# Patient Record
Sex: Female | Born: 1975 | Race: White | Hispanic: No | Marital: Single | Smoking: Former smoker
Health system: Southern US, Community
[De-identification: ages and names within clinical notes are randomized; demographics above are authoritative.]

## PROBLEM LIST (undated history)

## (undated) DIAGNOSIS — F329 Major depressive disorder, single episode, unspecified: Secondary | ICD-10-CM

## (undated) DIAGNOSIS — F319 Bipolar disorder, unspecified: Secondary | ICD-10-CM

## (undated) DIAGNOSIS — J302 Other seasonal allergic rhinitis: Secondary | ICD-10-CM

## (undated) DIAGNOSIS — F32A Depression, unspecified: Secondary | ICD-10-CM

## (undated) DIAGNOSIS — F909 Attention-deficit hyperactivity disorder, unspecified type: Secondary | ICD-10-CM

## (undated) DIAGNOSIS — M549 Dorsalgia, unspecified: Secondary | ICD-10-CM

## (undated) DIAGNOSIS — R51 Headache: Secondary | ICD-10-CM

## (undated) DIAGNOSIS — F419 Anxiety disorder, unspecified: Secondary | ICD-10-CM

## (undated) DIAGNOSIS — E559 Vitamin D deficiency, unspecified: Secondary | ICD-10-CM

## (undated) DIAGNOSIS — I10 Essential (primary) hypertension: Secondary | ICD-10-CM

## (undated) DIAGNOSIS — F429 Obsessive-compulsive disorder, unspecified: Secondary | ICD-10-CM

## (undated) DIAGNOSIS — K219 Gastro-esophageal reflux disease without esophagitis: Secondary | ICD-10-CM

## (undated) DIAGNOSIS — D649 Anemia, unspecified: Secondary | ICD-10-CM

## (undated) HISTORY — DX: Other seasonal allergic rhinitis: J30.2

## (undated) HISTORY — DX: Anxiety disorder, unspecified: F41.9

## (undated) HISTORY — DX: Bipolar disorder, unspecified: F31.9

## (undated) HISTORY — DX: Headache: R51

## (undated) HISTORY — DX: Obsessive-compulsive disorder, unspecified: F42.9

## (undated) HISTORY — PX: CARPAL TUNNEL RELEASE: SHX101

## (undated) HISTORY — DX: Dorsalgia, unspecified: M54.9

## (undated) HISTORY — DX: Attention-deficit hyperactivity disorder, unspecified type: F90.9

## (undated) HISTORY — DX: Depression, unspecified: F32.A

## (undated) HISTORY — DX: Major depressive disorder, single episode, unspecified: F32.9

---

## 2005-04-03 ENCOUNTER — Emergency Department (HOSPITAL_COMMUNITY): Admission: EM | Admit: 2005-04-03 | Discharge: 2005-04-03 | Payer: Self-pay | Admitting: Family Medicine

## 2006-11-03 ENCOUNTER — Emergency Department (HOSPITAL_COMMUNITY): Admission: EM | Admit: 2006-11-03 | Discharge: 2006-11-03 | Payer: Self-pay | Admitting: Emergency Medicine

## 2006-11-11 ENCOUNTER — Emergency Department (HOSPITAL_COMMUNITY): Admission: EM | Admit: 2006-11-11 | Discharge: 2006-11-11 | Payer: Self-pay | Admitting: Family Medicine

## 2006-12-31 ENCOUNTER — Emergency Department (HOSPITAL_COMMUNITY): Admission: EM | Admit: 2006-12-31 | Discharge: 2006-12-31 | Payer: Self-pay | Admitting: Emergency Medicine

## 2009-02-26 ENCOUNTER — Emergency Department (HOSPITAL_COMMUNITY): Admission: EM | Admit: 2009-02-26 | Discharge: 2009-02-26 | Payer: Self-pay | Admitting: Emergency Medicine

## 2009-12-26 ENCOUNTER — Emergency Department (HOSPITAL_COMMUNITY): Admission: EM | Admit: 2009-12-26 | Discharge: 2009-12-26 | Payer: Self-pay | Admitting: Emergency Medicine

## 2011-10-02 ENCOUNTER — Other Ambulatory Visit: Payer: Self-pay | Admitting: Family Medicine

## 2011-10-02 ENCOUNTER — Other Ambulatory Visit: Payer: Self-pay | Admitting: *Deleted

## 2011-10-02 DIAGNOSIS — M542 Cervicalgia: Secondary | ICD-10-CM

## 2011-10-04 ENCOUNTER — Other Ambulatory Visit: Payer: Self-pay

## 2011-10-09 ENCOUNTER — Other Ambulatory Visit: Payer: Self-pay

## 2011-10-09 ENCOUNTER — Inpatient Hospital Stay: Admission: RE | Admit: 2011-10-09 | Payer: Self-pay | Source: Ambulatory Visit

## 2014-03-12 ENCOUNTER — Other Ambulatory Visit: Payer: Self-pay | Admitting: Orthopaedic Surgery

## 2014-03-12 DIAGNOSIS — M545 Low back pain, unspecified: Secondary | ICD-10-CM

## 2014-03-20 ENCOUNTER — Other Ambulatory Visit: Payer: Self-pay

## 2014-04-02 ENCOUNTER — Inpatient Hospital Stay: Admission: RE | Admit: 2014-04-02 | Payer: Self-pay | Source: Ambulatory Visit

## 2014-05-21 ENCOUNTER — Ambulatory Visit (HOSPITAL_COMMUNITY): Payer: Self-pay | Admitting: Psychiatry

## 2014-05-24 ENCOUNTER — Encounter (INDEPENDENT_AMBULATORY_CARE_PROVIDER_SITE_OTHER): Payer: Self-pay

## 2014-05-24 ENCOUNTER — Encounter (HOSPITAL_COMMUNITY): Payer: Self-pay | Admitting: Psychiatry

## 2014-05-24 ENCOUNTER — Ambulatory Visit (INDEPENDENT_AMBULATORY_CARE_PROVIDER_SITE_OTHER): Payer: 59 | Admitting: Psychiatry

## 2014-05-24 VITALS — BP 105/57 | HR 86 | Ht 64.0 in | Wt 308.0 lb

## 2014-05-24 DIAGNOSIS — F331 Major depressive disorder, recurrent, moderate: Secondary | ICD-10-CM

## 2014-05-24 DIAGNOSIS — F17210 Nicotine dependence, cigarettes, uncomplicated: Secondary | ICD-10-CM

## 2014-05-24 DIAGNOSIS — F329 Major depressive disorder, single episode, unspecified: Secondary | ICD-10-CM

## 2014-05-24 DIAGNOSIS — F17200 Nicotine dependence, unspecified, uncomplicated: Secondary | ICD-10-CM

## 2014-05-24 DIAGNOSIS — F411 Generalized anxiety disorder: Secondary | ICD-10-CM | POA: Insufficient documentation

## 2014-05-24 MED ORDER — DIVALPROEX SODIUM ER 500 MG PO TB24
500.0000 mg | ORAL_TABLET | Freq: Two times a day (BID) | ORAL | Status: DC
Start: 2014-05-24 — End: 2014-08-14

## 2014-05-24 MED ORDER — BUPROPION HCL ER (XL) 150 MG PO TB24: ORAL_TABLET | ORAL | Status: DC

## 2014-05-24 NOTE — Progress Notes (Signed)
Psychiatric Assessment Adult  Patient Identification:  Morgan Romero Date of Evaluation:  05/24/2014 Chief Complaint: bipolar disorder History of Chief Complaint:   Chief Complaint  Patient presents with  . Establish Care    HPI Comments: Pt states she didn't like her previous psychiatrist (Dr. Tomasa Romero). She saw him last in December 2014. States she would like to establish care here. Reports she has been diagnosed with Bipolar disorder, panic attacks, GAD, OCD and ADD but she is not sure if she agrees with all the diagnoses.  States she is staying at home a lot trying to avoid social interactions. It takes her hours to get ready to go out and do things like grocery. Pt is worried about other people perception of her. States she knows a lot of people but she is not close to many. She does ok at work but in the "real world" she is closed off. States she does not invite anyone over and often avoids social gatherings and spends her time watching TV alone. States she will not go to work if she thinks someone doesn't like her.   States she has always been depressed but it worse now that her weight has increased. Reports little social support. Irritability is high and she will get verbally aggressive. Depression level is 5/10 and she feels down at least 3 days a week. Endorsing anhedonia, low motivation, hopelessness and worthlessness. Sleep is poor and she is getting about 6 hrs of broken sleep. On weekends she will sleep all day. Appetite is increased and she often will eat if food is in front of her. Energy is low and she is having back pain.  Denies SI/HI. Last time she had SI was 8 yrs ago. States when angry she has wished her friends dead or thought about ways to hurt them but has never acted on the thoughts.  Concentration is poor. States she often starts projects but never completes them. Reports she is easily distracted and often avoids complex tasks. Pt will rush thru work tasks and  will skip parts of what she is supposed to do. Denies missing bill payments b/c she does it every Friday. Reports she often misplaces items. Reports she stops listening to conversations after 2-3 minutes. States she is off for summer and states school is hard. Last semester she got A's, one B and 1 D in statistics. She was first diagnosed with ADHD 15 yrs ago by a psychiatrist. She was treated with Dextroamphetamine for several years. States it worked well. She has been off of all stimulants for 8 yrs.   Neurontin dose increase caused itching and swelling.  Pt would like to get off Lamictal or Depakote today and try Wellbutrin.     Review of Systems  Constitutional: Positive for fatigue.  HENT: Negative.   Eyes: Negative.   Respiratory: Negative.   Cardiovascular: Negative.   Gastrointestinal: Negative.   Musculoskeletal: Positive for back pain and gait problem.  Skin: Positive for rash.  Neurological: Negative.   Psychiatric/Behavioral: Positive for sleep disturbance, dysphoric mood and decreased concentration. The patient is nervous/anxious.    Physical Exam  Psychiatric: Her speech is normal and behavior is normal. Judgment and thought content normal. Cognition and memory are normal. She exhibits a depressed mood.    Depressive Symptoms: yes see HPI  (Hypo) Manic Symptoms:  Dx by Dr. Mliss Romero and pt is not sure if she has had a manic episode. Reports periods of high irritability with racing thoughts. During ths time  she spends her time watching tv and doing a lot of online shopping. Energy is not increased but she doesn't sleep as much due to racing thoughts. Elevated Mood:  No Irritable Mood:  Yes Grandiosity:  No Distractibility:  Yes Labiality of Mood:  No Delusions:  No Hallucinations:  No Impulsivity:  No Sexually Inappropriate Behavior:  Yes Financial Extravagance:  Yes Flight of Ideas:  Yes  Anxiety Symptoms: Excessive Worry:  yes worrying thru out the day. Anxiety  distracts her and she has racing thoughts. It causes insomnia and muscle tension. Denies HA, GI upset and fatigue Panic Symptoms:  yes- last time was a few weeks ago. Palpitations, SOB, lightheaded, shaking. Last for several minutes. Comes on with stress every few weeks. Agoraphobia:  Yes Obsessive Compulsive: yes thoughts about disaster from something like curling iron being on.  Symptoms: has to have things a certain way and if not she will fix it. doesn't engage in behavior much.  Specific Phobias:  No Social Anxiety:  yes see HPI  Psychotic Symptoms:  Hallucinations: No None Delusions:  No Paranoia:  No   Ideas of Reference:  No  PTSD Symptoms: Ever had a traumatic exposure:  No Had a traumatic exposure in the last month:  No Re-experiencing: No None Hypervigilance:  No Hyperarousal: No None Avoidance: No None  Traumatic Brain Injury: No  Past Psychiatric History: Diagnosis: Bipolar disorder, ADD, OCD, Panic attacks  Hospitalizations: denies  Outpatient Care: began treatment at age 62 in [place]. She was last treated by Dr. Tomasa Rand.  Substance Abuse Care: denies  Self-Mutilation: cutting 20 yrs ago  Suicidal Attempts: denies. Owns a gun that is secured in her home  Violent Behaviors: denies   Past Medical History:   Past Medical History  Diagnosis Date  . Anxiety   . Obsessive-compulsive disorder   . Bipolar disorder   . ADHD (attention deficit hyperactivity disorder)   . Depression   . Back pain   . Seasonal allergies   . Headache(784.0)    History of Loss of Consciousness:  No Seizure History:  No Cardiac History:  No Allergies:  No Known Allergies Current Medications:  Current Outpatient Prescriptions  Medication Sig Dispense Refill  . ALPRAZolam (XANAX) 0.5 MG tablet Take 0.5 mg by mouth 2 (two) times daily as needed for anxiety.      . divalproex (DEPAKOTE ER) 500 MG 24 hr tablet Take 500 mg by mouth 2 (two) times daily.      Marland Kitchen gabapentin (NEURONTIN) 300  MG capsule Take 300 mg by mouth 3 (three) times daily.      Marland Kitchen lamoTRIgine (LAMICTAL) 25 MG tablet Take 50 mg by mouth daily.      . naproxen (NAPROSYN) 500 MG tablet Take 500 mg by mouth 2 (two) times daily with a meal.       No current facility-administered medications for this visit.    Previous Psychotropic Medications:  Medication Dose   Zoloft- SI    Celexa   Lexapro   Dextroamphetamine             Substance Abuse History in the last 12 months: Substance Age of 1st Use Last Use Amount Specific Type  Nicotine   today 21 cigs/day cigs  Alcohol  14 March 2014 1-2 drinks a month beer  Cannabis  denies        Opiates  denies        Cocaine  denies        Methamphetamines  denies        LSD  denies        Ecstasy  denies         Benzodiazepines  denies        Caffeine  denies        Inhalants  denies        Others: denies                         Medical Consequences of Substance Abuse: denies  Legal Consequences of Substance Abuse: denies  Family Consequences of Substance Abuse: denies  Blackouts:  No DT's:  No Withdrawal Symptoms:  No None  Social History: Current Place of Residence: [place]. Lives alone for last 3 yrs. previously always had roommates.  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 Military History:  None. Legal History: denies Hobbies/Interests: now tv. Used to paint, read, dying her hair  Family History:   Family History  Problem Relation Age of Onset  . Anxiety disorder Mother   . Anxiety disorder Sister     Mental Status Examination/Evaluation:   Objective: Appearance: fairly groomed, appears to be stated age, obese  Attitude: Calm and cooperative  Eye Contact: Fair   Speech and Language: Clear and Coherent, spontaneous, normal rate  Volume: Normal   Mood: depressed  Affect: Full Range   Thought Process: Coherent   Orientation: Full (Time, Place, and Person)   Thought Content: WDL  Suicidal Thoughts: No  Homicidal Thoughts: No   Judgement: Fair   General fund of knowledge: average  Insight: Present   Psychomotor Activity: Normal   Akathisia: No   Handed: Right   AIMS (if indicated): n/a    Assets:  Communication Skills Desire for Improvement Financial Resources/Insurance Housing Leisure Time Resilience Talents/Skills Transportation Vocational/Educational    Laboratory/X-Ray Psychological Evaluation(s)   none to review denies   Assessment:  It is unclearn if pt has Bipolar disorder and OCD. Other than irritability she does not describe any hx of symptoms consistent with mania or hypomania. As far the OCD she does not describe any ritualistic behaviors that interfer with daily living. Will continue to monitor and asses.  MDD, GAD with panic attacks, r/o ADD, nicotine dependence  AXIS I MDD, GAD, Social anxiety disorder, Nicotine dependence, r/o ADHD  AXIS II Deferred  AXIS III Past Medical History  Diagnosis Date  . Anxiety   . Obsessive-compulsive disorder   . Bipolar disorder   . ADHD (attention deficit hyperactivity disorder)   . Depression   . Back pain   . Seasonal allergies   . Headache(784.0)      AXIS IV occupational problems and other psychosocial or environmental problems  AXIS V 51-60 moderate symptoms   Treatment Plan/Recommendations:  Plan of Care:  Start trial of Wellbutrin, risks/benefits and SE of the medication discussed. Pt verbalized understanding and verbal consent obtained for treatment.  Affirm with the patient that the medications are taken as ordered. Patient expressed  understanding of how their medications were to be used.   Confidentiality and exclusions reviewed with pt who verbalized understanding.   Laboratory:  pt will  fax over labs done this week  Psychotherapy: Therapy: brief supportive therapy provided. Discussed psychosocial stressors in detail.     Medications: Taper off Lamictal 25mg  x5 days then stop. Continue Depakote 1000mg  po qHS for mood symptoms including irritability. Continue Alprazolam 0.5mg  BID prn anxiety.  Start trial of Wellbutrin 75mg  x4 days then increase 150mg  qD for mood, concentration and smoking cessation.  Neurontin has off label indication for anxiety. She may benefit from the addition of Buspar or an SSRI for anxiety symptoms  Routine PRN Medications:  Yes  Consultations: encouraged to continue treatment with PCP as needed  Safety Concerns:  Pt denies SI and is at an acute low risk for suicide.Patient told to call clinic if any problems occur. Patient advised to go to ER  if she should develop SI/HI, side effects, or if symptoms worsen. Has crisis numbers to call if needed. Pt verbalized understanding.   Other:  F/up in 2 months or sooner if needed     Oletta DarterAGARWAL, SALINA, MD 6/25/201510:31 AM

## 2014-06-18 ENCOUNTER — Telehealth (HOSPITAL_COMMUNITY): Payer: Self-pay

## 2014-06-19 NOTE — Telephone Encounter (Signed)
Spoke with pt who is asking for increase in Wellbutrin. She has been on this medication since 05/24/2014. States it was helping in the beginning but 1.5 weeks ago she was depressed, unable to sleep, anxious, fearful of being hurt/judged and couldn't leave the house for 4 days. Reports it was likely triggered by a negative judgement from someone from work.  Today reports she is doing a little better and she is taking Xanax every morning. Pt has stopped smoking and is only using e-cigs.  A/P: MDD, GAD, Social anxiety disorder, Nicotine dependence, r/o ADHD   1. Continue Depakote 1000mg  po qHS for mood symptoms including irritability.  2. Continue Alprazolam 0.5mg  BID prn anxiety.  3. Wellbutrin 150mg  qD for mood, concentration and smoking cessation. As pt has only been on this dose for a few weeks will hold off on dose change to allow adequate trial.  4. Neurontin has off label indication for anxiety. She may benefit from the addition of Buspar or an SSRI for anxiety symptoms

## 2014-07-24 ENCOUNTER — Ambulatory Visit (HOSPITAL_COMMUNITY): Payer: Self-pay | Admitting: Psychiatry

## 2014-08-14 ENCOUNTER — Ambulatory Visit (INDEPENDENT_AMBULATORY_CARE_PROVIDER_SITE_OTHER): Payer: 59 | Admitting: Psychiatry

## 2014-08-14 ENCOUNTER — Encounter (HOSPITAL_COMMUNITY): Payer: Self-pay | Admitting: Psychiatry

## 2014-08-14 VITALS — BP 137/82 | HR 82 | Ht 64.0 in | Wt 306.0 lb

## 2014-08-14 DIAGNOSIS — F17200 Nicotine dependence, unspecified, uncomplicated: Secondary | ICD-10-CM

## 2014-08-14 DIAGNOSIS — F329 Major depressive disorder, single episode, unspecified: Secondary | ICD-10-CM

## 2014-08-14 DIAGNOSIS — F331 Major depressive disorder, recurrent, moderate: Secondary | ICD-10-CM

## 2014-08-14 DIAGNOSIS — F411 Generalized anxiety disorder: Secondary | ICD-10-CM

## 2014-08-14 DIAGNOSIS — F4010 Social phobia, unspecified: Secondary | ICD-10-CM | POA: Insufficient documentation

## 2014-08-14 MED ORDER — BUPROPION HCL ER (XL) 150 MG PO TB24: 150.0000 mg | ORAL_TABLET | Freq: Every day | ORAL | Status: DC

## 2014-08-14 MED ORDER — ALPRAZOLAM 0.5 MG PO TABS
0.5000 mg | ORAL_TABLET | Freq: Two times a day (BID) | ORAL | Status: DC | PRN
Start: 2014-08-14 — End: 2014-11-20

## 2014-08-14 NOTE — Progress Notes (Signed)
[facility] [id number] Progress Note  Morgan Romero [id number] 38 y.o.  08/14/2014 1:33 PM  Chief Complaint: "doing ok"  History of Present Illness: Pt is no longer Lamictal or Depakote. Stopped 2 months ago. States she was confused and thought the plan was to stop both. Also reports that she never took Depakote consistently. Mood is stable and she feels she is doing better. Pt is working a different position at work and likes it. Reports she has no social life and all her family is out of state. States she avoided a party last weekend but is very social at work. Reports she is drained after work and wants to go home. At time she feels others are getting job perks that she is not.   Anxiety is tolerable and she is taking Xanax 2-3x/week. She needs Xanax before going into social situations (hair cut, party, job interview).  It calms her down. She is comfortable as long as she is home.  Depression is not affecting her much unless she thinks about it. Endorsing anhedonia. Reports some worthlessness and hopelessness. Sleeping about 6 hrs/night. Energy is on low side. Appetite is variable and she is trying to control her junk food intake.   Denies manic and hypomanic symptoms including decreased need for sleep and increased energy, impulsivity and mood lability.  Pt is taking Wellbutrin and Xanax as prescribed and denies SE.  Suicidal Ideation: No Plan Formed: No Patient has means to carry out plan: No  Homicidal Ideation: No Plan Formed: No Patient has means to carry out plan: No  Review of Systems: Psychiatric: Agitation: Yes reports she is sensitive.  Hallucination: No Depressed Mood: No Insomnia: No Hypersomnia: No Altered Concentration: No Feels Worthless: Yes Grandiose Ideas: No Belief In Special Powers: No New/Increased Substance Abuse: No Compulsions: No  Neurologic: Headache: No Seizure: No Paresthesias: No  Past Medical Family, Social History: Pt lives  alone. She wants to be a Research officer, trade union. 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 Pt was raised in [place] by her parents and has 1 sister. Pt is single and doesn't have any kids. Pt is taking classes at [employer].    Family History  Problem Relation Age of Onset  . Anxiety disorder Mother   . Anxiety disorder Sister   . Suicidality Neg Hx    Past Medical History  Diagnosis Date  . Anxiety   . Obsessive-compulsive disorder   . Bipolar disorder   . ADHD (attention deficit hyperactivity disorder)   . Depression   . Back pain   . Seasonal allergies   . AOZHYQMV(784.6)     Outpatient Encounter Prescriptions as of 08/14/2014  Medication Sig  . ALPRAZolam (XANAX) 0.5 MG tablet Take 0.5 mg by mouth 2 (two) times daily as needed for anxiety.  Marland Kitchen buPROPion (WELLBUTRIN XL) 150 MG 24 hr tablet  for 4 days then increase to  po qD  . GARCINIA CAMBOGIA-CHROMIUM PO Take 1 tablet by mouth.  . naproxen (NAPROSYN) 500 MG tablet Take 500 mg by mouth 2 (two) times daily with a meal.  . vitamin B-12 (CYANOCOBALAMIN) 250 MCG tablet Take 250 mcg by mouth daily.  . divalproex (DEPAKOTE ER) 500 MG 24 hr tablet Take 1 tablet (500 mg total) by mouth 2 (two) times daily.  Marland Kitchen gabapentin (NEURONTIN) 300 MG capsule Take 300 mg by mouth 3 (three) times daily.    Past Psychiatric History/Hospitalization(s): Anxiety: Yes Bipolar Disorder: Yes Depression: Yes Mania: No Psychosis: No  Schizophrenia: No Personality Disorder: No Hospitalization for psychiatric illness: No History of Electroconvulsive Shock Therapy: No Prior Suicide Attempts: No  Physical Exam: Constitutional:  BP 137/82  Pulse 82  Ht  (1.626 m)  Wt 306 lb (138.801 kg)  BMI 52.50 kg/m2  General Appearance: alert, oriented, no acute distress  Musculoskeletal: Strength & Muscle Tone: within normal limits Gait & Station: normal Patient leans: N/A  Mental Status Examination/Evaluation: Objective: Attitude: Calm  and cooperative  Appearance: Well Groomed, appears to be stated age  Eye Contact::  Good  Speech:  Clear and Coherent and Normal Rate  Volume:  Normal  Mood:  euthymic  Affect:  Full Range  Thought Process:  Goal Directed, Linear and Logical  Orientation:  Full (Time, Place, and Person)  Thought Content:  Negative  Suicidal Thoughts:  No  Homicidal Thoughts:  No  Judgement:  Fair  Insight:  Fair  Concentration: good  Memory: Immediate-fair Recent-fair Remote-fair  Recall: fair  Language: fair  Gait and Station: normal  [employer] of Knowledge: average  Psychomotor Activity:  Normal  Akathisia:  No  Handed:  Right  AIMS (if indicated):  n/a    Medical Decision Making (Choose Three): Established Problem, Stable/Improving (1), Review of Psycho-Social Stressors (1), Review or order clinical lab tests (1) and Review of Medication Regimen & Side Effects (2)     Assessment: It is unclearn if pt has Bipolar disorder and OCD. Other than irritability she does not describe any hx of symptoms consistent with mania or hypomania. As far the OCD she does not describe any ritualistic behaviors that interfer with daily living. Will continue to monitor and asses. MDD, GAD with panic attacks, r/o ADD, nicotine dependence    AXIS I  MDD, GAD, Social anxiety disorder, Nicotine dependence, r/o ADHD   AXIS II  Deferred   AXIS III  Past Medical History    Diagnosis  Date    .  Anxiety     .  Obsessive-compulsive disorder     .  Bipolar disorder     .  ADHD (attention deficit hyperactivity disorder)     .  Depression     .  Back pain     .  Seasonal allergies     .  Headache(784.0)    AXIS IV  occupational problems and other psychosocial or environmental problems   AXIS V  51-60 moderate symptoms      Treatment Plan/Recommendations:  Plan of Care:  Medication management with supportive therapy. Risks/benefits and SE of the medication discussed. Pt verbalized understanding and verbal  consent obtained for treatment.  Affirm with the patient that the medications are taken as ordered. Patient expressed understanding of how their medications were to be used.  -improvement of symptoms   Laboratory: pt will sign MR to get recent labs  Psychotherapy: Therapy: brief supportive therapy provided. Discussed psychosocial stressors in detail.   Medications:  discontinue Depakote as pt stopped taking it 2 months ago. States she was rarely taking it before. Pt is aware that if she does have Bipolar d/o and is not taking a mood stabilizer then she could develop mania and she is aware of what symptoms to monitor for.   Continue Alprazolam 0.5mg  BID prn anxiety.  Wellbutrin  qD for mood, concentration and smoking cessation.   She may benefit from the addition of Buspar or an SSRI for anxiety symptoms   Routine PRN Medications: Yes   Consultations: encouraged to continue treatment with  PCP as needed   Safety Concerns: Pt denies SI and is at an acute low risk for suicide.Patient told to call clinic if any problems occur. Patient advised to go to ER if she should develop SI/HI, side effects, or if symptoms worsen. Has crisis numbers to call if needed. Pt verbalized understanding.   Other: F/up in 3 months or sooner if needed      Oletta Darter, MD 08/14/2014

## 2014-11-13 ENCOUNTER — Ambulatory Visit (HOSPITAL_COMMUNITY): Payer: Self-pay | Admitting: Psychiatry

## 2014-11-15 ENCOUNTER — Ambulatory Visit (HOSPITAL_COMMUNITY): Payer: Self-pay | Admitting: Psychiatry

## 2014-11-20 ENCOUNTER — Encounter (HOSPITAL_COMMUNITY): Payer: Self-pay | Admitting: Psychiatry

## 2014-11-20 ENCOUNTER — Ambulatory Visit (INDEPENDENT_AMBULATORY_CARE_PROVIDER_SITE_OTHER): Payer: 59 | Admitting: Psychiatry

## 2014-11-20 VITALS — BP 160/90 | HR 84 | Ht 64.0 in | Wt 320.0 lb

## 2014-11-20 DIAGNOSIS — F17200 Nicotine dependence, unspecified, uncomplicated: Secondary | ICD-10-CM

## 2014-11-20 DIAGNOSIS — F411 Generalized anxiety disorder: Secondary | ICD-10-CM

## 2014-11-20 DIAGNOSIS — F4010 Social phobia, unspecified: Secondary | ICD-10-CM

## 2014-11-20 DIAGNOSIS — F331 Major depressive disorder, recurrent, moderate: Secondary | ICD-10-CM

## 2014-11-20 MED ORDER — ALPRAZOLAM 0.5 MG PO TABS: 0.5000 mg | ORAL_TABLET | Freq: Two times a day (BID) | ORAL | Status: DC | PRN

## 2014-11-20 MED ORDER — BUPROPION HCL ER (XL) 300 MG PO TB24: 300.0000 mg | ORAL_TABLET | Freq: Every day | ORAL | Status: DC

## 2014-11-20 NOTE — Progress Notes (Signed)
[facility] [id number] Progress Note  Morgan Romero [id number] 38 y.o.  11/20/2014 3:27 PM  Chief Complaint: "---"  History of Present Illness: Pt is now working from home but isn't getting out much.   One of her cats died in October. Pt had her for 17 yrs. Pt misses her and thinks about her a lot.   Pt sleeps a lot (11 hrs a day), unmotivated, anhedonia, gaining weight even though on a diet. She gets teary with certain commercials. Pt is not sure if she is sad. Energy is low. Concentration is poor and she has trouble completing her work.   Pt is on a diet that is controlling her carb intake.   Reports she has no social life and all her family is out of state. States she avoided going out for the most part.   Anxiety is tolerable and she is taking Xanax 2-3x/week. She needs Xanax before going into social situations (hair cut, party, job interview).  It calms her down. She is comfortable as long as she is home. Pt avoids situations that cause Xanax.    Denies manic and hypomanic symptoms including decreased need for sleep and increased energy, impulsivity and mood lability.  Pt is taking Wellbutrin and Xanax as prescribed and denies SE.   Suicidal Ideation: No Plan Formed: No Patient has means to carry out plan: No  Homicidal Ideation: No Plan Formed: No Patient has means to carry out plan: No  Review of Systems: Psychiatric: Agitation: Yes reports she is sensitive.  Hallucination: No Depressed Mood: Yes Insomnia: No Hypersomnia: Yes Altered Concentration: Yes Feels Worthless: Yes Grandiose Ideas: No Belief In Special Powers: No New/Increased Substance Abuse: No Compulsions: Yes picks at skin on fingers when around other people  Neurologic: Headache: Yes Seizure: No Paresthesias: Yes in fingers   Review of Systems  Constitutional: Negative for fever, chills and weight loss.  HENT: Negative for congestion, nosebleeds and sore throat.   Eyes: Negative  for blurred vision, double vision, discharge and redness.  Respiratory: Negative for cough, sputum production and shortness of breath.   Cardiovascular: Negative for chest pain, palpitations and leg swelling.  Gastrointestinal: Negative for heartburn, nausea, vomiting and abdominal pain.  Musculoskeletal: Positive for back pain. Negative for myalgias, joint pain and neck pain.  Skin: Negative.   Neurological: Positive for sensory change and headaches. Negative for dizziness, seizures and loss of consciousness.  Psychiatric/Behavioral: Positive for depression. Negative for suicidal ideas, hallucinations and substance abuse. The patient is nervous/anxious. The patient does not have insomnia.      Past Medical Family, Social History: Pt lives alone. She wants to be a Research officer, trade unionmortician. 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 Pt was raised in [place] by her parents and has 1 sister. Pt is single and doesn't have any kids. [facility] is [facility] [employer].    Family History  Problem Relation Age of Onset  . Anxiety disorder Mother   . Anxiety disorder Sister   . Suicidality Neg Hx    Past Medical History  Diagnosis Date  . Anxiety   . Obsessive-compulsive disorder   . Bipolar disorder   . ADHD (attention deficit hyperactivity disorder)   . Depression   . Back pain   . Seasonal allergies   . XBJYNWGN(562.1Headache(784.0)     Outpatient Encounter Prescriptions as of 11/20/2014  Medication Sig  . ALPRAZolam (XANAX) 0.5 MG tablet Take 1 tablet (0.5 mg total) by mouth 2 (two)  times daily as needed for anxiety.  Marland Kitchen. buPROPion (WELLBUTRIN XL) 150 MG 24 hr tablet Take 1 tablet (150 mg total) by mouth daily. 75mg  for 4 days then increase to 150mg  po qD  . GARCINIA CAMBOGIA-CHROMIUM PO Take 1 tablet by mouth.  . naproxen (NAPROSYN) 500 MG tablet Take 500 mg by mouth 2 (two) times daily with a meal.  . vitamin B-12 (CYANOCOBALAMIN) 250 MCG tablet Take 250 mcg by mouth daily.    Past Psychiatric  History/Hospitalization(s): Anxiety: Yes Bipolar Disorder: Yes Depression: Yes Mania: No Psychosis: No Schizophrenia: No Personality Disorder: No Hospitalization for psychiatric illness: No History of Electroconvulsive Shock Therapy: No Prior Suicide Attempts: No  Physical Exam: Constitutional:  BP 160/90 mmHg  Pulse 84  Ht 5\' 4"  (1.626 m)  Wt 320 lb (145.151 kg)  BMI 54.90 kg/m2 Pt states BP high b/c she is at doctor's office. She checks it regularity and at home it is WNL.   General Appearance: alert, oriented, no acute distress  Musculoskeletal: Strength & Muscle Tone: within normal limits Gait & Station: normal Patient leans: N/A  Mental Status Examination/Evaluation: Objective: Attitude: Calm and cooperative  Appearance: Well Groomed, appears to be stated age  Eye Contact::  Good  Speech:  Clear and Coherent and Normal Rate  Volume:  Normal  Mood:  Depressed  Affect:  Full Range  Thought Process:  Goal Directed, Linear and Logical  Orientation:  Full (Time, Place, and Person)  Thought Content:  Negative  Suicidal Thoughts:  No  Homicidal Thoughts:  No  Judgement:  Fair  Insight:  Fair  Concentration: good  Memory: Immediate-fair Recent-fair Remote-fair  Recall: fair  Language: fair  Gait and Station: normal  [employer] of Knowledge: average  Psychomotor Activity:  Normal  Akathisia:  No  Handed:  Right  AIMS (if indicated):  n/a    Medical Decision Making (Choose Three): Review of Psycho-Social Stressors (1), Review or order clinical lab tests (1), Established Problem, Worsening (2) and Review of Medication Regimen & Side Effects (2)     Assessment: It is unclearn if pt has Bipolar disorder and OCD. Other than irritability she does not describe any hx of symptoms consistent with mania or hypomania. As far the OCD she does not describe any ritualistic behaviors that interfer with daily living. Will continue to monitor and asses. MDD, GAD with panic  attacks, r/o ADD, nicotine dependence    AXIS I  MDD, GAD, Social anxiety disorder, Nicotine dependence, r/o ADHD   AXIS II  Deferred   AXIS III  Past Medical History    Diagnosis  Date    .  Anxiety     .  Obsessive-compulsive disorder     .  Bipolar disorder     .  ADHD (attention deficit hyperactivity disorder)     .  Depression     .  Back pain     .  Seasonal allergies     .  Headache(784.0)    AXIS IV  occupational problems and other psychosocial or environmental problems   AXIS V  51-60 moderate symptoms      Treatment Plan/Recommendations:  Plan of Care:  Medication management with supportive therapy. Risks/benefits and SE of the medication discussed. Pt verbalized understanding and verbal consent obtained for treatment.  Affirm with the patient that the medications are taken as ordered. Patient expressed understanding of how their medications were to be used.  -worsening of symptoms   Laboratory: reviewed labs from PCP  with pt 05/21/2014 CBC WNL, TSH 5.1, CMP WNL, T3 and T4 WNL. Will place labs in chart for scanning  Psychotherapy: Therapy: brief supportive therapy provided. Discussed psychosocial stressors in detail.   Recommend light therapy  Medications:  Continue Alprazolam 0.5mg  BID prn anxiety.  Increase Wellbutrin XL to 300mg  qD for mood, concentration and smoking cessation.   She may benefit from the addition of Buspar or an SSRI for anxiety symptoms   Routine PRN Medications: Yes   Consultations: encouraged to continue treatment with PCP as needed   Safety Concerns: Pt denies SI and is at an acute low risk for suicide.Patient told to call clinic if any problems occur. Patient advised to go to ER if she should develop SI/HI, side effects, or if symptoms worsen. Has crisis numbers to call if needed. Pt verbalized understanding.   Other: F/up in 2 months or sooner if needed      Oletta Darter, MD 11/20/2014

## 2015-01-22 ENCOUNTER — Ambulatory Visit (INDEPENDENT_AMBULATORY_CARE_PROVIDER_SITE_OTHER): Payer: 59 | Admitting: Psychiatry

## 2015-01-22 ENCOUNTER — Encounter (HOSPITAL_COMMUNITY): Payer: Self-pay | Admitting: Psychiatry

## 2015-01-22 VITALS — BP 148/82 | HR 79 | Ht 64.0 in | Wt 315.2 lb

## 2015-01-22 DIAGNOSIS — F4010 Social phobia, unspecified: Secondary | ICD-10-CM

## 2015-01-22 DIAGNOSIS — F418 Other specified anxiety disorders: Secondary | ICD-10-CM

## 2015-01-22 DIAGNOSIS — F17200 Nicotine dependence, unspecified, uncomplicated: Secondary | ICD-10-CM

## 2015-01-22 DIAGNOSIS — F331 Major depressive disorder, recurrent, moderate: Secondary | ICD-10-CM

## 2015-01-22 DIAGNOSIS — F411 Generalized anxiety disorder: Secondary | ICD-10-CM

## 2015-01-22 MED ORDER — BUPROPION HCL ER (XL) 300 MG PO TB24: 300.0000 mg | ORAL_TABLET | Freq: Every day | ORAL | Status: DC

## 2015-01-22 MED ORDER — VENLAFAXINE HCL ER 150 MG PO CP24: 150.0000 mg | ORAL_CAPSULE | Freq: Every day | ORAL | Status: DC

## 2015-01-22 NOTE — Progress Notes (Signed)
[facility] [id number] Progress Note  PRABHNOOR ELLENBERGER [id number] 39 y.o.  01/22/2015 3:59 PM  Chief Complaint: "about the same"  History of Present Illness: States things are the same.  Pt is now working from home but isn't getting out much. She is trying to get herself out to the gym but it is hard.  Pt sleeps about 6 hrs/day. She remains unmotivated, anhedonia, gaining weight even though on a diet. Denies sad but can get teary when encountering sad things. Energy remains low. Concentration is poor and she has trouble completing her work but it is getting it done. Pt is easily distracted and a few times a week she will forget names and things.  Pt is on a diet but it is not going well. She is on a program thru her work. Pt is looking for support and advice.   Reports she has no social life and all her family is out of state. States she avoided going out for the most part.   Anxiety is tolerable and she is taking Xanax 2-3x/week. She needs Xanax before going into social situations (hair cut, party, job interview).  It calms her down. She is comfortable as long as she is home. Pt avoids situations that cause Xanax. Pt only has anxiety when going out.     Denies manic and hypomanic symptoms including decreased need for sleep and increased energy, impulsivity and mood lability.  Pt is taking Wellbutrin and Xanax as prescribed and denies SE. No change with increase in Wellbutrin.   Suicidal Ideation: No Plan Formed: No Patient has means to carry out plan: No  Homicidal Ideation: No Plan Formed: No Patient has means to carry out plan: No  Review of Systems: Psychiatric: Agitation: Yes reports she is sensitive.  Hallucination: No Depressed Mood: Yes Insomnia: No Hypersomnia: No Altered Concentration: Yes Feels Worthless: Yes Grandiose Ideas: No Belief In Special Powers: No New/Increased Substance Abuse: No Compulsions: Yes picks at skin on fingers when around other  people is a little better  Neurologic: Headache: Yes Seizure: No Paresthesias: Yes in fingers   Review of Systems  Constitutional: Negative for fever and chills.  HENT: Negative for congestion, nosebleeds and sore throat.   Eyes: Negative for blurred vision, double vision and redness.  Respiratory: Negative for cough, sputum production and shortness of breath.   Cardiovascular: Negative for chest pain, palpitations and leg swelling.  Gastrointestinal: Positive for heartburn. Negative for nausea, vomiting and abdominal pain.  Musculoskeletal: Positive for back pain. Negative for joint pain and neck pain.  Skin: Negative for itching and rash.  Neurological: Positive for speech change and headaches. Negative for dizziness, seizures, loss of consciousness and weakness.  Psychiatric/Behavioral: Positive for depression. Negative for suicidal ideas, hallucinations and substance abuse. The patient is nervous/anxious. The patient does not have insomnia.      Past Medical Family, Social History: Pt lives alone. She wants to be a Research officer, trade union. 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 Pt was raised in [place] by her parents and has 1 sister. Pt is single and doesn't have any kids. Pt is taking classes at [employer].    Family History  Problem Relation Age of Onset  . Anxiety disorder Mother   . Anxiety disorder Sister   . Suicidality Neg Hx    Past Medical History  Diagnosis Date  . Anxiety   . Obsessive-compulsive disorder   . Bipolar disorder   . ADHD (attention deficit hyperactivity disorder)   .  Depression   . Back pain   . Seasonal allergies   . WUJWJXBJ(478.2Headache(784.0)     Outpatient Encounter Prescriptions as of 01/22/2015  Medication Sig  . ALPRAZolam (XANAX) 0.5 MG tablet Take 1 tablet (0.5 mg total) by mouth 2 (two) times daily as needed for anxiety.  Marland Kitchen. buPROPion (WELLBUTRIN XL) 300 MG 24 hr tablet Take 1 tablet (300 mg total) by mouth daily.  Marland Kitchen. GARCINIA CAMBOGIA-CHROMIUM  PO Take 1 tablet by mouth.  . naproxen (NAPROSYN) 500 MG tablet Take 500 mg by mouth 2 (two) times daily with a meal.  . vitamin B-12 (CYANOCOBALAMIN) 250 MCG tablet Take 250 mcg by mouth daily.    Past Psychiatric History/Hospitalization(s): Anxiety: Yes Bipolar Disorder: Yes Depression: Yes Mania: No Psychosis: No Schizophrenia: No Personality Disorder: No Hospitalization for psychiatric illness: No History of Electroconvulsive Shock Therapy: No Prior Suicide Attempts: No  Physical Exam: Constitutional:  BP 148/82 mmHg  Pulse 79  Ht 5\' 4"  (1.626 m)  Wt 315 lb 3.2 oz (142.974 kg)  BMI 54.08 kg/m2 Pt states BP high b/c she is at doctor's office. She checks it regularity and at home it is WNL.   General Appearance: alert, oriented, no acute distress  Musculoskeletal: Strength & Muscle Tone: within normal limits Gait & Station: normal Patient leans: N/A  Mental Status Examination/Evaluation: Objective: Attitude: Calm and cooperative  Appearance: Well Groomed, appears to be stated age  Eye Contact::  Good  Speech:  Clear and Coherent and Normal Rate  Volume:  Normal  Mood:  Depressed  Affect:  Depressed  Thought Process:  Goal Directed, Linear and Logical  Orientation:  Full (Time, Place, and Person)  Thought Content:  Negative  Suicidal Thoughts:  No  Homicidal Thoughts:  No  Judgement:  Fair  Insight:  Fair  Concentration: good  Memory: Immediate-fair Recent-fair Remote-fair  Recall: fair  Language: fair  Gait and Station: normal  [employer] of Knowledge: average  Psychomotor Activity:  Normal  Akathisia:  No  Handed:  Right  AIMS (if indicated):  n/a    Medical Decision Making (Choose Three): Review of Psycho-Social Stressors (1), Established Problem, Worsening (2), Review of Medication Regimen & Side Effects (2) and Review of New Medication or Change in Dosage (2)     Assessment: It is unclearn if pt has Bipolar disorder and OCD. Other than  irritability she does not describe any hx of symptoms consistent with mania or hypomania. As far the OCD she does not describe any ritualistic behaviors that interfer with daily living. Will continue to monitor and asses. MDD, GAD with panic attacks, r/o ADD, nicotine dependence    AXIS I  MDD-moderate, recurrent; GAD, Social anxiety disorder, Nicotine dependence, r/o ADHD   AXIS II  Deferred   AXIS III  Past Medical History    Diagnosis  Date    .  Anxiety     .  Obsessive-compulsive disorder     .  Bipolar disorder     .  ADHD (attention deficit hyperactivity disorder)     .  Depression     .  Back pain     .  Seasonal allergies     .  Headache(784.0)    AXIS IV  occupational problems and other psychosocial or environmental problems   AXIS V  51-60 moderate symptoms      Treatment Plan/Recommendations:  Plan of Care:  Medication management with supportive therapy. Risks/benefits and SE of the medication discussed. Pt verbalized understanding and  verbal consent obtained for treatment.  Affirm with the patient that the medications are taken as ordered. Patient expressed understanding of how their medications were to be used.  -worsening of symptoms   Laboratory: reviewed labs from PCP with pt 05/21/2014 CBC WNL, TSH 5.1, CMP WNL, T3 and T4 WNL. Will place labs in chart for scanning  Psychotherapy: Therapy: brief supportive therapy provided. Discussed psychosocial stressors in detail.   Recommend light therapy  Medications:  Continue Alprazolam 0.5mg  BID prn anxiety.   Wellbutrin XL  qD for mood, concentration and smoking cessation.  Start trial of Effexor XR  po qD for depression and anxiety symptoms   Routine PRN Medications: Yes   Consultations: encouraged to continue treatment with PCP as needed   Safety Concerns: Pt denies SI and is at an acute low risk for suicide.Patient told to call clinic if any problems occur. Patient advised to go to ER if she should develop  SI/HI, side effects, or if symptoms worsen. Has crisis numbers to call if needed. Pt verbalized understanding.   Other: F/up in 2 months or sooner if needed      Oletta Darter, MD 01/22/2015

## 2015-03-26 ENCOUNTER — Ambulatory Visit (INDEPENDENT_AMBULATORY_CARE_PROVIDER_SITE_OTHER): Payer: 59 | Admitting: Psychiatry

## 2015-03-26 ENCOUNTER — Encounter (HOSPITAL_COMMUNITY): Payer: Self-pay | Admitting: Psychiatry

## 2015-03-26 VITALS — BP 150/84 | HR 90 | Ht 64.0 in | Wt 299.8 lb

## 2015-03-26 DIAGNOSIS — F331 Major depressive disorder, recurrent, moderate: Secondary | ICD-10-CM

## 2015-03-26 DIAGNOSIS — F418 Other specified anxiety disorders: Secondary | ICD-10-CM

## 2015-03-26 DIAGNOSIS — F4010 Social phobia, unspecified: Secondary | ICD-10-CM

## 2015-03-26 DIAGNOSIS — F17200 Nicotine dependence, unspecified, uncomplicated: Secondary | ICD-10-CM

## 2015-03-26 DIAGNOSIS — F411 Generalized anxiety disorder: Secondary | ICD-10-CM | POA: Diagnosis not present

## 2015-03-26 MED ORDER — ALPRAZOLAM 0.5 MG PO TABS: 0.5000 mg | ORAL_TABLET | Freq: Two times a day (BID) | ORAL | Status: DC | PRN

## 2015-03-26 MED ORDER — VENLAFAXINE HCL ER 75 MG PO CP24: 225.0000 mg | ORAL_CAPSULE | Freq: Every day | ORAL | Status: DC

## 2015-03-26 MED ORDER — BUPROPION HCL ER (XL) 300 MG PO TB24: 300.0000 mg | ORAL_TABLET | Freq: Every day | ORAL | Status: DC

## 2015-03-26 NOTE — Progress Notes (Signed)
[facility] [phone] Progress Note  Morgan Romero [id number] 39 y.o.  03/26/2015 4:17 PM  Chief Complaint: "pretty good"  History of Present Illness: Pt is not hungry all the time like she used to be. She has lost some weight and is happy about it. Pt now eats when she needs to.   Pt likes the Effexor and states it helps. It gives her more energy but doesn't make her jittery.   Pt is working from home but isn't getting out much. Pt still gets distracted when working due to the internet. She is trying to get herself out to the gym but it is hard.  Pt sleeps about 6 hrs/day. Energy is low during the day and she could sleep anytime during the day. It takes her a while to fall asleep at night.   Depression is unchanged. She has been doing some projects. Anhedonia is improving some. She remains unmotivated for the most part. Pt will do things that are incorporated into her routine. For the last 3 days she has been getting dressed in the morning and it helps her so she can go out during the day. Denies sad but can get teary when encountering sad things. .   Reports she has no social life and all her family is out of state. States she avoided going out for the most part.   Anxiety is tolerable and she is taking Xanax 2-3x/week. She needs Xanax before going into social situations (hair cut, party, job interview).  It calms her down. She is comfortable as long as she is home. Pt avoids situations that cause Xanax. Pt only has anxiety when going out.     Denies manic and hypomanic symptoms including decreased need for sleep and increased energy, impulsivity and mood lability.  Pt is taking Wellbutrin, Effexor and Xanax as prescribed and denies SE.   Suicidal Ideation: No Plan Formed: No Patient has means to carry out plan: No  Homicidal Ideation: No Plan Formed: No Patient has means to carry out plan: No  Review of Systems: Psychiatric: Agitation: No   Hallucination:  No Depressed Mood: Yes Insomnia: No Hypersomnia: No Altered Concentration: Yes Feels Worthless: Yes Grandiose Ideas: No Belief In Special Powers: No New/Increased Substance Abuse: No Compulsions: Yes picks at skin on fingers when around other people is a little better  Neurologic: Headache: Yes Seizure: No Paresthesias: Yes in fingers   Review of Systems  Constitutional: Positive for weight loss. Negative for fever and chills.  HENT: Positive for congestion. Negative for ear pain, nosebleeds and sore throat.   Eyes: Negative for double vision, pain and redness.  Respiratory: Positive for cough and sputum production. Negative for wheezing.   Cardiovascular: Negative for chest pain, leg swelling and PND.  Gastrointestinal: Negative for heartburn, nausea, vomiting and abdominal pain.  Musculoskeletal: Negative for back pain, joint pain and neck pain.  Skin: Negative for itching and rash.  Neurological: Positive for sensory change and headaches. Negative for dizziness, seizures and loss of consciousness.  Endo/Heme/Allergies: Positive for environmental allergies.  Psychiatric/Behavioral: Positive for depression. Negative for suicidal ideas, hallucinations and substance abuse. The patient is nervous/anxious. The patient does not have insomnia.      Past Medical Family, Social History: Pt lives alone. She wants to be a Research officer, trade union. 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 Pt was raised in [place] by her parents and has 1 sister. Pt is single and doesn't have any kids. Pt is  taking classes at [employer].    Family History  Problem Relation Age of Onset  . Anxiety disorder Mother   . Anxiety disorder Sister   . Suicidality Neg Hx    Past Medical History  Diagnosis Date  . Anxiety   . Obsessive-compulsive disorder   . Bipolar disorder   . ADHD (attention deficit hyperactivity disorder)   . Depression   . Back pain   . Seasonal allergies   . ZOXWRUEA(540.9Headache(784.0)      Outpatient Encounter Prescriptions as of 03/26/2015  Medication Sig  . ALPRAZolam (XANAX) 0.5 MG tablet Take 1 tablet (0.5 mg total) by mouth 2 (two) times daily as needed for anxiety.  Marland Kitchen. buPROPion (WELLBUTRIN XL) 300 MG 24 hr tablet Take 1 tablet (300 mg total) by mouth daily.  Marland Kitchen. GARCINIA CAMBOGIA-CHROMIUM PO Take 1 tablet by mouth.  . naproxen (NAPROSYN) 500 MG tablet Take 500 mg by mouth 2 (two) times daily with a meal.  . venlafaxine XR (EFFEXOR-XR) 150 MG 24 hr capsule Take 1 capsule (150 mg total) by mouth daily.  . vitamin B-12 (CYANOCOBALAMIN) 250 MCG tablet Take 250 mcg by mouth daily.    Past Psychiatric History/Hospitalization(s): Anxiety: Yes Bipolar Disorder: Yes Depression: Yes Mania: No Psychosis: No Schizophrenia: No Personality Disorder: No Hospitalization for psychiatric illness: No History of Electroconvulsive Shock Therapy: No Prior Suicide Attempts: No  Physical Exam: Constitutional:  BP 150/84 mmHg  Pulse 90  Ht 5\' 4"  (1.626 m)  Wt 299 lb 12.8 oz (135.988 kg)  BMI 51.44 kg/m2 Pt states BP high b/c she is at doctor's office. She checks it regularity and at home it is WNL.   General Appearance: alert, oriented, no acute distress  Musculoskeletal: Strength & Muscle Tone: within normal limits Gait & Station: normal Patient leans: N/A  Mental Status Examination/Evaluation: Objective: Attitude: Calm and cooperative  Appearance: Well Groomed, appears to be stated age  Eye Contact::  Good  Speech:  Clear and Coherent and Normal Rate  Volume:  Normal  Mood:  Depressed  Affect:  Full Range- brighter than at previous appts  Thought Process:  Goal Directed, Linear and Logical  Orientation:  Full (Time, Place, and Person)  Thought Content:  Negative  Suicidal Thoughts:  No  Homicidal Thoughts:  No  Judgement:  Fair  Insight:  Fair  Concentration: good  Memory: Immediate-fair Recent-fair Remote-fair  Recall: fair  Language: fair  Gait and  Station: normal  [employer] of Knowledge: average  Psychomotor Activity:  Normal  Akathisia:  No  Handed:  Right  AIMS (if indicated):  n/a    Medical Decision Making (Choose Three): Review of Psycho-Social Stressors (1), Established Problem, Worsening (2), Review of Medication Regimen & Side Effects (2) and Review of New Medication or Change in Dosage (2)     Assessment: It is unclearn if pt has Bipolar disorder and OCD. Other than irritability she does not describe any hx of symptoms consistent with mania or hypomania. As far the OCD she does not describe any ritualistic behaviors that interfer with daily living. Will continue to monitor and asses. MDD, GAD with panic attacks, r/o ADD, nicotine dependence    AXIS I  MDD-moderate, recurrent; GAD, Social anxiety disorder, Nicotine dependence, r/o ADHD   AXIS II  Deferred   AXIS III  Past Medical History    Diagnosis  Date    .  Anxiety     .  Obsessive-compulsive disorder     .  Bipolar disorder     .  ADHD (attention deficit hyperactivity disorder)     .  Depression     .  Back pain     .  Seasonal allergies     .  Headache(784.0)    AXIS IV  occupational problems and other psychosocial or environmental problems   AXIS V  51-60 moderate symptoms      Treatment Plan/Recommendations:  Plan of Care:  Medication management with supportive therapy. Risks/benefits and SE of the medication discussed. Pt verbalized understanding and verbal consent obtained for treatment.  Affirm with the patient that the medications are taken as ordered. Patient expressed understanding of how their medications were to be used.  -no change in symptoms overall   Laboratory: reviewed labs from PCP with pt 05/21/2014 CBC WNL, TSH 5.1, CMP WNL, T3 and T4 WNL. Will place labs in chart for scanning  Psychotherapy: Therapy: brief supportive therapy provided. Discussed psychosocial stressors in detail.   Recommend light therapy  Medications:  Continue  Alprazolam 0.5mg  BID prn anxiety.   Wellbutrin XL  qD for mood, concentration and smoking cessation.  increase Effexor XR  po qD for depression and anxiety symptoms   Routine PRN Medications: Yes   Consultations: encouraged to continue treatment with PCP as needed   Safety Concerns: Pt denies SI and is at an acute low risk for suicide.Patient told to call clinic if any problems occur. Patient advised to go to ER if she should develop SI/HI, side effects, or if symptoms worsen. Has crisis numbers to call if needed. Pt verbalized understanding.   Other: F/up in 6 months or sooner if needed      Oletta Darter, MD 03/26/2015

## 2015-05-27 ENCOUNTER — Telehealth (HOSPITAL_COMMUNITY): Payer: Self-pay | Admitting: *Deleted

## 2015-05-27 NOTE — Telephone Encounter (Signed)
[facility] called stating they needed a confirmation on patient's Ativan prescription because [facility] [place] and her Xanax had never been filled. The state law does not allow controlled substance to be transferred without new prescription being called unless it had been filled at least once. Spoke with Mike-pharmacist in [place] [facility] and read the order from Xanax on file. No further action is needed.

## 2015-09-26 ENCOUNTER — Ambulatory Visit (HOSPITAL_COMMUNITY): Payer: Self-pay | Admitting: Psychiatry

## 2015-10-16 ENCOUNTER — Other Ambulatory Visit (HOSPITAL_COMMUNITY): Payer: Self-pay | Admitting: Psychiatry

## 2015-10-23 ENCOUNTER — Other Ambulatory Visit (HOSPITAL_COMMUNITY): Payer: Self-pay | Admitting: Psychiatry

## 2016-04-20 DIAGNOSIS — J302 Other seasonal allergic rhinitis: Secondary | ICD-10-CM | POA: Insufficient documentation

## 2022-01-23 ENCOUNTER — Encounter: Payer: Self-pay | Admitting: Nurse Practitioner

## 2022-01-23 ENCOUNTER — Other Ambulatory Visit: Payer: Self-pay

## 2022-01-23 ENCOUNTER — Ambulatory Visit (INDEPENDENT_AMBULATORY_CARE_PROVIDER_SITE_OTHER): Payer: 59 | Admitting: Nurse Practitioner

## 2022-01-23 VITALS — BP 160/90 | HR 92 | Temp 98.7°F | Ht 64.0 in | Wt 333.0 lb

## 2022-01-23 DIAGNOSIS — Z6841 Body Mass Index (BMI) 40.0 and over, adult: Secondary | ICD-10-CM | POA: Diagnosis not present

## 2022-01-23 DIAGNOSIS — I10 Essential (primary) hypertension: Secondary | ICD-10-CM | POA: Diagnosis not present

## 2022-01-23 DIAGNOSIS — F909 Attention-deficit hyperactivity disorder, unspecified type: Secondary | ICD-10-CM | POA: Diagnosis not present

## 2022-01-23 LAB — COMPREHENSIVE METABOLIC PANEL
ALT: 19 U/L (ref 0–35)
AST: 17 U/L (ref 0–37)
Albumin: 4.1 g/dL (ref 3.5–5.2)
Alkaline Phosphatase: 69 U/L (ref 39–117)
BUN: 12 mg/dL (ref 6–23)
CO2: 29 mEq/L (ref 19–32)
Calcium: 9.3 mg/dL (ref 8.4–10.5)
Chloride: 104 mEq/L (ref 96–112)
Creatinine, Ser: 0.9 mg/dL (ref 0.40–1.20)
GFR: 77.42 mL/min (ref >60.00)
Glucose, Bld: 82 mg/dL (ref 70–99)
Potassium: 3.8 mEq/L (ref 3.5–5.1)
Sodium: 137 mEq/L (ref 135–145)
Total Bilirubin: 0.4 mg/dL (ref 0.2–1.2)
Total Protein: 7.6 g/dL (ref 6.0–8.3)

## 2022-01-23 NOTE — Progress Notes (Signed)
Subjective:  Patient ID: Morgan Romero, female    DOB: [birth year]  Age: 46 y.o. MRN: [id number]  CC:  Chief Complaint  Patient presents with   New Patient (Initial Visit)      HPI  This patient arrives today for the above.  She recently moved back to this area from [place].  She lived here in the past as well.  She does bring recent lab results that she has on her phone, labs were drawn on 09/19/2021.  They are as listed below:  A1c 5.6  TSH 3.750  Lipids 190 total, 122 triglycerides, 56 HDL, 112 LDL  CMP 87 glucose, 9 BUN, 0.99 creatinine, 87 alk phos, EGFR 72, 140 sodium, 4.7 potassium, 20 AST, 24 ALT, CO2 24, chloride 103, calcium 9.5, total protein 7.3, albumin 4.5, total bili 0.3  Vitamin D 24.6  CBC White blood cell 8.2, red blood cell 4.92, hemoglobin 14.0, hematocrit 42.5, MCV 86, RDW 13.4%, platelets 254  Her main concern is that she was recently approved for bariatric surgery and she has paperwork that needs to be filled out by PCP for [facility] surgery.  It appears that the paperwork is requesting past 5 years worth of weight as well as comorbidities associated with patient's obesity and previous weight loss attempts.  Unfortunately the patient does not have medical records with her today regarding previous weights or previous weight loss attempts but reports that she has tried a weight loss program.  As part of this program she has made lifestyle changes with nutrition she is also tried pharmacological treatment such as metformin and topiramate.  She tells me she is tried phentermine years in the past but did not tolerate it well.  She is also tried to increase her exercise but reports chronic back pain which limits her ability to exercise.  She is also been on over-the-counter supplements like Garcinia-Cambogia without sustained results.  She does have a comorbidity of hypertension.  She is prescribed lisinopril-hydrochlorothiazide, she is not sure if  she took her medication today.  She does report having an at home blood pressure cuff.  She also has ADHD and tells me she is getting low on her Adderall which she takes for treatment of this.  She is trying to get back in with her old psychiatrist was having a hard time getting an appointment scheduled.  Past Medical History:  Diagnosis Date   ADHD (attention deficit hyperactivity disorder)    Anxiety    Back pain    Bipolar disorder (HCC)    Depression    Headache(784.0)    Obsessive-compulsive disorder    Seasonal allergies       Family History  Problem Relation Age of Onset   Anxiety disorder Mother    Anxiety disorder Sister    Suicidality Neg Hx     Social History   Social History Narrative   Not on file   Social History   Tobacco Use   Smoking status: Every Day    Packs/day: 0.25    Years: 15.00    Pack years: 3.75    Types: E-cigarettes, Cigarettes   Smokeless tobacco: Never  Substance Use Topics   Alcohol use: Yes    Comment: 1-2 drinks/month     Current Meds  Medication Sig   ALPRAZolam (XANAX) 0.5 MG tablet Take 1 tablet (0.5 mg total) by mouth 2 (two) times daily as needed for anxiety.   buPROPion (WELLBUTRIN XL) 300 MG 24 hr tablet  Take 1 tablet (300 mg total) by mouth daily.   Cholecalciferol 25 MCG (1000 UT) CHEW Chew 1 capsule by mouth daily.   Garlic 2671 MG CAPS Take 1 capsule by mouth daily.   ibuprofen (ADVIL) 200 MG tablet Take by mouth.   lisinopril-hydrochlorothiazide (ZESTORETIC) 10-12.5 MG tablet Take 1 tablet by mouth daily.   metFORMIN (GLUCOPHAGE-XR) 500 MG 24 hr tablet Take 500 mg by mouth 2 (two) times daily.   naproxen (NAPROSYN) 500 MG tablet Take 500 mg by mouth 2 (two) times daily with a meal.   topiramate (TOPAMAX) 25 MG tablet Take by mouth.   vitamin B-12 (CYANOCOBALAMIN) 250 MCG tablet Take 250 mcg by mouth daily.   [DISCONTINUED] ALPRAZolam (XANAX) 0.5 MG tablet Take by mouth.   [DISCONTINUED]  amphetamine-dextroamphetamine (ADDERALL) 20 MG tablet Take 1 tablet by mouth daily. XR   [DISCONTINUED] cyclobenzaprine (FLEXERIL) 10 MG tablet TAKE ONE TABLET BY MOUTH THREE TIMES A DAY FOR UP TO 10 DAYS   [DISCONTINUED] GARCINIA CAMBOGIA-CHROMIUM PO Take 1 tablet by mouth.   [DISCONTINUED] HYDROcodone-acetaminophen (NORCO/VICODIN) 5-325 MG tablet Take 1 tablet by mouth every 6 (six) hours.    ROS:  Review of Systems  Eyes:  Positive for blurred vision.  Respiratory:  Negative for shortness of breath.   Cardiovascular:  Negative for chest pain.  Neurological:  Negative for headaches.    Objective:   Today's Vitals: BP (!) 160/90    Pulse 92    Temp 98.7 F (37.1 C) (Oral)    Ht 5' 4"  (1.626 m)    Wt (!) 333 lb (151 kg)    LMP  (LMP Unknown)    SpO2 92%    BMI 57.16 kg/m  Vitals with BMI 01/23/2022 01/23/2022  Height - 5' 4"   Weight - 333 lbs  BMI - 24.58  Systolic 099 833  Diastolic 90 825  Pulse - 92  Some encounter information is confidential and restricted. Go to Review Flowsheets activity to see all data.     Physical Exam Vitals reviewed.  Constitutional:      General: She is not in acute distress.    Appearance: Normal appearance.  HENT:     Head: Normocephalic and atraumatic.  Neck:     Vascular: No carotid bruit.  Cardiovascular:     Rate and Rhythm: Normal rate and regular rhythm.     Pulses: Normal pulses.     Heart sounds: Normal heart sounds.  Pulmonary:     Effort: Pulmonary effort is normal.     Breath sounds: Normal breath sounds.  Skin:    General: Skin is warm and dry.  Neurological:     General: No focal deficit present.     Mental Status: She is alert and oriented to person, place, and time.  Psychiatric:        Mood and Affect: Mood normal.        Behavior: Behavior normal.        Judgment: Judgment normal.         Assessment and Plan   1. Class 3 severe obesity with serious comorbidity and body mass index (BMI) of 50.0 to 59.9 in  adult, unspecified obesity type ([place])   2. Attention deficit hyperactivity disorder (ADHD), unspecified ADHD type   3. Hypertension, unspecified type      Plan: 1.  I have asked for her to bring previous records from weight loss programs I can use these to assist me with filling out her paperwork for  bariatric surgery.  She tells me she will do this and bring that at her next follow-up. 2.,  3.  Blood pressure very uncontrolled currently.  She does report that she thinks she has whitecoat syndrome so I have asked her to check blood pressure on her at home cuff between now next office visit in 1 week to monitor closely.  She was told to make sure she takes her blood pressure medications as prescribed.  We will check blood work for further evaluation as well today.  If blood pressure remains elevated at next office visit we will have to make medication adjustments.  We will consider refilling her Adderall once blood pressure is better controlled.  We did discuss signs of ACS and stroke and if these were to occur she needs to proceed to the emergency department.  She reports understanding.  She was encouraged to continue trying to schedule follow-up with her psychiatrist, and she tells me she will do so.   Tests ordered Orders Placed This Encounter  Procedures   Comprehensive metabolic panel      No orders of the defined types were placed in this encounter.   Patient to follow-up in 1 week for close monitoring of blood pressure.  [name], NP

## 2022-01-30 ENCOUNTER — Ambulatory Visit (INDEPENDENT_AMBULATORY_CARE_PROVIDER_SITE_OTHER): Payer: 59 | Admitting: Nurse Practitioner

## 2022-01-30 ENCOUNTER — Other Ambulatory Visit: Payer: Self-pay

## 2022-01-30 VITALS — BP 150/88 | HR 95 | Temp 98.8°F | Ht 64.0 in | Wt 333.1 lb

## 2022-01-30 DIAGNOSIS — N921 Excessive and frequent menstruation with irregular cycle: Secondary | ICD-10-CM

## 2022-01-30 DIAGNOSIS — F909 Attention-deficit hyperactivity disorder, unspecified type: Secondary | ICD-10-CM

## 2022-01-30 DIAGNOSIS — R413 Other amnesia: Secondary | ICD-10-CM

## 2022-01-30 DIAGNOSIS — Z6841 Body Mass Index (BMI) 40.0 and over, adult: Secondary | ICD-10-CM

## 2022-01-30 DIAGNOSIS — F429 Obsessive-compulsive disorder, unspecified: Secondary | ICD-10-CM | POA: Diagnosis not present

## 2022-01-30 DIAGNOSIS — I10 Essential (primary) hypertension: Secondary | ICD-10-CM

## 2022-01-30 DIAGNOSIS — F419 Anxiety disorder, unspecified: Secondary | ICD-10-CM

## 2022-01-30 DIAGNOSIS — Z124 Encounter for screening for malignant neoplasm of cervix: Secondary | ICD-10-CM

## 2022-01-30 MED ORDER — BUPROPION HCL ER (XL) 150 MG PO TB24: 150.0000 mg | ORAL_TABLET | Freq: Every day | ORAL | 0 refills | Status: DC

## 2022-01-30 NOTE — Progress Notes (Signed)
? ? ? ?Subjective:  ?Patient ID: Morgan Romero, female    DOB: 01/18/1976  Age: 46 y.o. MRN: [id number] ? ?CC:  ?Chief Complaint  ?Patient presents with  ? Follow-up  ?  Blood pressure   ?  ? ? ?HPI  ?This patient arrives today for the above. ? ?She is here for f/u of elevated blood pressure. She established care with me last visit and at that time had elevated blood pressure but felt that it was related to whitecoat syndrome.  She was told to check blood pressures at home and bring log of blood pressures with her to the office.  She tells me she has been checking her blood pressure at home but forgot her logs there.  She tells me that her blood pressure has been nearly as high as it has been today or last time she was seen here.  She again appears to remain asymptomatic as she denies chest pain or shortness of breath. ? ?She also has a history of ADHD, anxiety, and OCD.  She has tried to get established with psychiatry here but is having a hard time getting an appointment scheduled.  She would like referral today if possible.  We did discuss possibly starting her back on Adderall, however due to her elevated blood pressures been hesitant to restart the medication until this is better controlled.  She would be open to other treatment options.  I do see that she is on Wellbutrin 300 mg daily. ? ?She also mentions to me today that she is concerned about memory and short-term memory loss.  She tells me often times she will walk into a room and forget why she was there.  Last metabolic panel was within normal limits, last TSH level was also normal, and A1c was normal. ? ?She also reports that she is due for cervical cancer screening via Pap smear.  She would like referral to OB/GYN for this today. ? ? ?Estimated Creatinine Clearance: 116.3 mL/min (by C-G formula based on SCr of 0.9 mg/dL). ? ?Past Medical History:  ?Diagnosis Date  ? ADHD (attention deficit hyperactivity disorder)   ? Anxiety   ? Back pain   ?  Bipolar disorder (HCC)   ? Depression   ? Headache(784.0)   ? Obsessive-compulsive disorder   ? Seasonal allergies   ? ? ? ? ?Family History  ?Problem Relation Age of Onset  ? Anxiety disorder Mother   ? Anxiety disorder Sister   ? Suicidality Neg Hx   ? ? ?Social History  ? ?Social History Narrative  ? Not on file  ? ?Social History  ? ?Tobacco Use  ? Smoking status: Every Day  ?  Packs/day: 0.25  ?  Years: 15.00  ?  Pack years: 3.75  ?  Types: E-cigarettes, Cigarettes  ? Smokeless tobacco: Never  ?Substance Use Topics  ? Alcohol use: Yes  ?  Comment: 1-2 drinks/month  ? ? ? ?Current Meds  ?Medication Sig  ? ALPRAZolam (XANAX) 0.5 MG tablet Take 1 tablet (0.5 mg total) by mouth 2 (two) times daily as needed for anxiety.  ? buPROPion (WELLBUTRIN XL) 150 MG 24 hr tablet Take 1 tablet (150 mg total) by mouth daily.  ? buPROPion (WELLBUTRIN XL) 300 MG 24 hr tablet Take 1 tablet (300 mg total) by mouth daily.  ? Cholecalciferol 25 MCG (1000 UT) CHEW Chew 1 capsule by mouth daily.  ? Garlic 1000 MG CAPS Take 1 capsule by mouth daily.  ? ibuprofen (  ADVIL) 200 MG tablet Take by mouth.  ? lisinopril-hydrochlorothiazide (ZESTORETIC) 10-12.5 MG tablet Take 1 tablet by mouth daily.  ? metFORMIN (GLUCOPHAGE-XR) 500 MG 24 hr tablet Take 500 mg by mouth 2 (two) times daily.  ? naproxen (NAPROSYN) 500 MG tablet Take 500 mg by mouth 2 (two) times daily with a meal.  ? topiramate (TOPAMAX) 25 MG tablet Take by mouth.  ? vitamin B-12 (CYANOCOBALAMIN) 250 MCG tablet Take 250 mcg by mouth daily.  ? ? ?ROS:  ?Review of Systems  ?Respiratory:  Negative for shortness of breath.   ?Cardiovascular:  Negative for chest pain.  ?Neurological:  Negative for headaches.  ? ? ?Objective:  ? ?Today's Vitals: BP (!) 150/88   Pulse 95   Temp 98.8 ?F (37.1 ?C) (Oral)   Ht 5\' 4"  (1.626 m)   Wt (!) 333 lb 2 oz (151.1 kg)   LMP  (LMP Unknown)   SpO2 96%   BMI 57.18 kg/m?  ?Vitals with BMI 01/30/2022 01/23/2022 01/23/2022  ?Height 5\' 4"  - 5\' 4"    ?Weight 333 lbs 2 oz - 333 lbs  ?BMI 57.15 - 57.13  ?Systolic 150 160 409  ?Diastolic 88 90 102  ?Pulse 95 - 92  ?Some encounter information is confidential and restricted. Go to Review Flowsheets activity to see all data.  ?  ? ?Physical Exam ?Vitals reviewed.  ?Constitutional:   ?   General: She is not in acute distress. ?   Appearance: Normal appearance.  ?HENT:  ?   Head: Normocephalic and atraumatic.  ?Neck:  ?   Vascular: No carotid bruit.  ?Cardiovascular:  ?   Rate and Rhythm: Normal rate and regular rhythm.  ?   Pulses: Normal pulses.  ?   Heart sounds: Normal heart sounds.  ?Pulmonary:  ?   Effort: Pulmonary effort is normal.  ?   Breath sounds: Normal breath sounds.  ?Skin: ?   General: Skin is warm and dry.  ?Neurological:  ?   General: No focal deficit present.  ?   Mental Status: She is alert and oriented to person, place, and time.  ?Psychiatric:     ?   Mood and Affect: Mood normal.     ?   Behavior: Behavior normal.     ?   Judgment: Judgment normal.  ? ? ? ? ? ? ? ?Assessment and Plan  ? ?1. Obsessive-compulsive disorder, unspecified type   ?2. Attention deficit hyperactivity disorder (ADHD), unspecified ADHD type   ?3. Anxiety   ?4. Short-term memory loss   ?5. Menorrhagia with irregular cycle   ?6. Cervical cancer screening   ?7. Class 3 severe obesity with serious comorbidity and body mass index (BMI) of 50.0 to 59.9 in adult, unspecified obesity type (HCC)   ?8. Hypertension, unspecified type   ? ? ? ?Plan: ?1.-3.  Per shared decision making we will increase her Wellbutrin to 450 mg by mouth daily.  She will follow-up in about 1 month to see how he is tolerating medication.  We did discuss if she experiences any suicidal ideation she needs to notify me before her next appointment and she is willing to do this.  We will also refer to psychiatry for assistance with managing her chronic psychiatric disorders. ?4.  We will check a vitamin B12 level for further evaluation.  I think this is most  likely related to her ADHD as well as anxiety.  This may improve with increase in Wellbutrin as well as with possible  addition of Adderall or other stimulant when she is established with psychiatry. ?5.,  6.  We will refer to OB/GYN for assistance with evaluating her irregular menstrual cycle and do cervical cancer screening. ?7.  She did bring some additional records today and I will update her paperwork for upcoming bariatric surgery. ?8.  Blood pressure still uncontrolled.  She is agreeable to sending me a MyChart message with blood pressure readings from home.  May consider increasing her antihypertensive dose pending these readings. ? ? ? ?Tests ordered ?Orders Placed This Encounter  ?Procedures  ? Vitamin B12  ? Vitamin B12  ? Ambulatory referral to Psychiatry  ? Ambulatory referral to Obstetrics / Gynecology  ? ? ? ? ?Meds ordered this encounter  ?Medications  ? buPROPion (WELLBUTRIN XL) 150 MG 24 hr tablet  ?  Sig: Take 1 tablet (150 mg total) by mouth daily.  ?  Dispense:  90 tablet  ?  Refill:  0  ?  Order Specific Question:   Supervising Provider  ?  Answer:   Pincus Sanes [[id number]]  ? ? ?Patient to follow-up in 1 month for annual physical exam, or sooner as needed. ? ?Elenore Paddy, NP ? ?

## 2022-01-31 LAB — VITAMIN B12: Vitamin B-12: 301 pg/mL (ref 200–1100)

## 2022-02-06 ENCOUNTER — Encounter: Payer: Self-pay | Admitting: Nurse Practitioner

## 2022-02-06 DIAGNOSIS — G8929 Other chronic pain: Secondary | ICD-10-CM | POA: Insufficient documentation

## 2022-02-06 DIAGNOSIS — M549 Dorsalgia, unspecified: Secondary | ICD-10-CM | POA: Insufficient documentation

## 2022-02-18 ENCOUNTER — Encounter: Payer: Self-pay | Admitting: Nurse Practitioner

## 2022-02-19 ENCOUNTER — Other Ambulatory Visit: Payer: Self-pay | Admitting: Nurse Practitioner

## 2022-02-19 DIAGNOSIS — I10 Essential (primary) hypertension: Secondary | ICD-10-CM

## 2022-02-19 MED ORDER — LISINOPRIL-HYDROCHLOROTHIAZIDE 20-12.5 MG PO TABS: 1.0000 | ORAL_TABLET | Freq: Every day | ORAL | 0 refills | Status: DC

## 2022-02-19 NOTE — Progress Notes (Signed)
I am sending a higher dose of patient's blood pressure medication to her pharmacy to achieve better control of her hypertension.  Please call her to reschedule her upcoming appointment to one within the next 2 weeks with me for blood pressure check and metabolic panel recheck.  I have sent her a message in her MyChart also to notify her of this, but if she has any questions please let me know.  Thank you. ?

## 2022-02-19 NOTE — Progress Notes (Signed)
Called patient and left message for patient to call office to r/s her appt with Maralyn Sago ?

## 2022-02-27 ENCOUNTER — Ambulatory Visit (INDEPENDENT_AMBULATORY_CARE_PROVIDER_SITE_OTHER): Payer: 59 | Admitting: Nurse Practitioner

## 2022-02-27 ENCOUNTER — Other Ambulatory Visit: Payer: Self-pay | Admitting: Nurse Practitioner

## 2022-02-27 VITALS — BP 132/80 | HR 97 | Temp 99.3°F | Ht 64.0 in | Wt 335.0 lb

## 2022-02-27 DIAGNOSIS — F419 Anxiety disorder, unspecified: Secondary | ICD-10-CM

## 2022-02-27 DIAGNOSIS — I10 Essential (primary) hypertension: Secondary | ICD-10-CM

## 2022-02-27 DIAGNOSIS — E876 Hypokalemia: Secondary | ICD-10-CM

## 2022-02-27 DIAGNOSIS — F429 Obsessive-compulsive disorder, unspecified: Secondary | ICD-10-CM | POA: Insufficient documentation

## 2022-02-27 DIAGNOSIS — F909 Attention-deficit hyperactivity disorder, unspecified type: Secondary | ICD-10-CM | POA: Diagnosis not present

## 2022-02-27 LAB — COMPREHENSIVE METABOLIC PANEL
ALT: 19 U/L (ref 0–35)
AST: 15 U/L (ref 0–37)
Albumin: 4.1 g/dL (ref 3.5–5.2)
Alkaline Phosphatase: 65 U/L (ref 39–117)
BUN: 14 mg/dL (ref 6–23)
CO2: 29 mEq/L (ref 19–32)
Calcium: 9.4 mg/dL (ref 8.4–10.5)
Chloride: 99 mEq/L (ref 96–112)
Creatinine, Ser: 1.04 mg/dL (ref 0.40–1.20)
GFR: 65.04 mL/min (ref >60.00)
Glucose, Bld: 87 mg/dL (ref 70–99)
Potassium: 3.4 mEq/L — ABNORMAL LOW (ref 3.5–5.1)
Sodium: 136 mEq/L (ref 135–145)
Total Bilirubin: 0.4 mg/dL (ref 0.2–1.2)
Total Protein: 7.6 g/dL (ref 6.0–8.3)

## 2022-02-27 MED ORDER — BUPROPION HCL ER (XL) 150 MG PO TB24: 450.0000 mg | ORAL_TABLET | Freq: Every day | ORAL | 1 refills | Status: DC

## 2022-02-27 NOTE — Assessment & Plan Note (Signed)
Chronic, stable. Continue on Wellbutrin and higher dose of 450 mg by mouth daily.  Referral made to psychiatry again today with request to verify offices within patient at work. ?

## 2022-02-27 NOTE — Patient Instructions (Signed)
[facility] ?

## 2022-02-27 NOTE — Assessment & Plan Note (Signed)
Chronic, improved.  Continue on lisinopril-hydrochlorothiazide 20-12.5 mg/day.  Repeat CMP, further recommendations may be made based upon these results. ?

## 2022-02-27 NOTE — Assessment & Plan Note (Signed)
Chronic, stable. Continue on Wellbutrin and higher dose of 450 mg by mouth daily.  Referral made to psychiatry again today with request to verify offices within patient at work. ?

## 2022-02-27 NOTE — Progress Notes (Signed)
? ? ? ?Subjective:  ?Patient ID: Morgan Romero, female    DOB: [birth year]  Age: 46 y.o. MRN: [id number] ? ?CC:  ?Chief Complaint  ?Patient presents with  ? Follow-up  ?  Referral for behavior health was not in her network  ?  ? ? ?HPI  ?This patient arrives today for the above. ? ?ADHD/OCD/anxiety: Wellbutrin increased to 450mg  by mouth daily at last office visit.  She reports tolerating medication increase well.  She denies any suicidal ideation.  She has been on Adderall in the past, but I recommended referral to psychiatry for assistance with managing this.  Referral was made however she reports all that she was referred to does not except her insurance. ? ?Hypertension: Increased lisinopril-hydrochlorothiazide to 20-12.5 mg approximately 2 weeks ago.  She is tolerating the dose increase well. ? ?She also reports that she is scheduled to see bariatric surgery in about 2 weeks for initial consultation regarding bariatric surgery for treatment of obesity. ? ?Past Medical History:  ?Diagnosis Date  ? ADHD (attention deficit hyperactivity disorder)   ? Anxiety   ? Back pain   ? Bipolar disorder (HCC)   ? Depression   ? Headache(784.0)   ? Obsessive-compulsive disorder   ? Seasonal allergies   ? ? ? ? ?Family History  ?Problem Relation Age of Onset  ? Anxiety disorder Mother   ? Anxiety disorder Sister   ? Suicidality Neg Hx   ? ? ?Social History  ? ?Social History Narrative  ? Not on file  ? ?Social History  ? ?Tobacco Use  ? Smoking status: Every Day  ?  Packs/day: 0.25  ?  Years: 15.00  ?  Pack years: 3.75  ?  Types: E-cigarettes, Cigarettes  ? Smokeless tobacco: Never  ?Substance Use Topics  ? Alcohol use: Yes  ?  Comment: 1-2 drinks/month  ? ? ? ?Current Meds  ?Medication Sig  ? ALPRAZolam (XANAX) 0.5 MG tablet Take 1 tablet (0.5 mg total) by mouth 2 (two) times daily as needed for anxiety.  ? buPROPion (WELLBUTRIN XL) 150 MG 24 hr tablet Take 1 tablet (150 mg total) by mouth daily.  ? buPROPion  (WELLBUTRIN XL) 300 MG 24 hr tablet Take 1 tablet (300 mg total) by mouth daily.  ? Cholecalciferol 25 MCG (1000 UT) CHEW Chew 1 capsule by mouth daily.  ? Garlic 1000 MG CAPS Take 1 capsule by mouth daily.  ? ibuprofen (ADVIL) 200 MG tablet Take by mouth.  ? lisinopril-hydrochlorothiazide (ZESTORETIC) 20-12.5 MG tablet Take 1 tablet by mouth daily.  ? metFORMIN (GLUCOPHAGE-XR) 500 MG 24 hr tablet Take 500 mg by mouth 2 (two) times daily.  ? naproxen (NAPROSYN) 500 MG tablet Take 500 mg by mouth 2 (two) times daily with a meal.  ? topiramate (TOPAMAX) 25 MG tablet Take by mouth.  ? vitamin B-12 (CYANOCOBALAMIN) 250 MCG tablet Take 250 mcg by mouth daily.  ? ? ?ROS:  ?Review of Systems  ?Eyes:  Negative for blurred vision.  ?Respiratory:  Negative for shortness of breath.   ?Cardiovascular:  Negative for chest pain.  ?Neurological:  Negative for dizziness.  ?Psychiatric/Behavioral:  Negative for suicidal ideas.   ? ? ?Objective:  ? ?Today's Vitals: BP 132/80 (BP Location: Right Arm, Patient Position: Sitting, Cuff Size: Large)   Pulse 97   Temp 99.3 ?F (37.4 ?C) (Oral)   Ht 5\' 4"  (1.626 m)   Wt (!) 335 lb (152 kg)   SpO2 96%   BMI 57.50  kg/m?  ? ?  02/27/2022  ?  2:43 PM 01/30/2022  ?  3:59 PM 01/23/2022  ?  2:00 PM  ?Vitals with BMI  ?Height 5\' 4"  5\' 4"    ?Weight 335 lbs 333 lbs 2 oz   ?BMI 57.47 57.15   ?Systolic 132 150  ?Diastolic 80 88 90  ?Pulse 97 95   ?  ? ?Physical Exam ?Vitals reviewed.  ?Constitutional:   ?   General: She is not in acute distress. ?   Appearance: Normal appearance.  ?HENT:  ?   Head: Normocephalic and atraumatic.  ?Neck:  ?   Vascular: No carotid bruit.  ?Cardiovascular:  ?   Rate and Rhythm: Normal rate and regular rhythm.  ?   Pulses: Normal pulses.  ?   Heart sounds: Normal heart sounds.  ?Pulmonary:  ?   Effort: Pulmonary effort is normal.  ?   Breath sounds: Normal breath sounds.  ?Skin: ?   General: Skin is warm and dry.  ?Neurological:  ?   General: No focal deficit present.   ?   Mental Status: She is alert and oriented to person, place, and time.  ?Psychiatric:     ?   Mood and Affect: Mood normal.     ?   Behavior: Behavior normal.     ?   Judgment: Judgment normal.  ? ? ? ? ? ? ? ?Assessment and Plan  ? ?No diagnosis found. ? ? ?Plan: ?See plan via problem list below. ? ? ?Tests ordered ?No orders of the defined types were placed in this encounter. ? ? ? ? ?No orders of the defined types were placed in this encounter. ? ? ?Patient to follow-up in 3 months, or sooner as needed. ? ? , NP ? ?

## 2022-03-13 ENCOUNTER — Other Ambulatory Visit: Payer: Self-pay | Admitting: Nurse Practitioner

## 2022-03-13 ENCOUNTER — Other Ambulatory Visit (INDEPENDENT_AMBULATORY_CARE_PROVIDER_SITE_OTHER): Payer: 59

## 2022-03-13 ENCOUNTER — Ambulatory Visit: Payer: 59 | Admitting: Nurse Practitioner

## 2022-03-13 DIAGNOSIS — E876 Hypokalemia: Secondary | ICD-10-CM | POA: Diagnosis not present

## 2022-03-13 LAB — BASIC METABOLIC PANEL
BUN: 14 mg/dL (ref 6–23)
CO2: 29 mEq/L (ref 19–32)
Calcium: 9.4 mg/dL (ref 8.4–10.5)
Chloride: 101 mEq/L (ref 96–112)
Creatinine, Ser: 0.98 mg/dL (ref 0.40–1.20)
GFR: 69.83 mL/min (ref >60.00)
Glucose, Bld: 52 mg/dL — ABNORMAL LOW (ref 70–99)
Potassium: 3.4 mEq/L — ABNORMAL LOW (ref 3.5–5.1)
Sodium: 136 mEq/L (ref 135–145)

## 2022-03-13 LAB — MAGNESIUM: Magnesium: 2.1 mg/dL (ref 1.5–2.5)

## 2022-03-13 MED ORDER — POTASSIUM CHLORIDE CRYS ER 10 MEQ PO TBCR: 10.0000 meq | EXTENDED_RELEASE_TABLET | Freq: Every day | ORAL | 1 refills | Status: DC

## 2022-03-16 ENCOUNTER — Other Ambulatory Visit: Payer: Self-pay | Admitting: Surgery

## 2022-03-16 ENCOUNTER — Other Ambulatory Visit (HOSPITAL_COMMUNITY): Payer: Self-pay | Admitting: Surgery

## 2022-03-19 ENCOUNTER — Other Ambulatory Visit: Payer: Self-pay | Admitting: Nurse Practitioner

## 2022-03-19 DIAGNOSIS — I10 Essential (primary) hypertension: Secondary | ICD-10-CM

## 2022-03-31 ENCOUNTER — Encounter: Payer: Self-pay | Admitting: Nurse Practitioner

## 2022-04-01 ENCOUNTER — Ambulatory Visit: Payer: 59 | Admitting: Medical

## 2022-04-03 ENCOUNTER — Encounter (HOSPITAL_COMMUNITY): Payer: Self-pay

## 2022-04-03 ENCOUNTER — Ambulatory Visit (HOSPITAL_COMMUNITY)
Admission: RE | Admit: 2022-04-03 | Discharge: 2022-04-03 | Disposition: A | Discharge status: Home / Self Care | Payer: No Typology Code available for payment source | Source: Ambulatory Visit | Attending: Surgery | Admitting: Surgery

## 2022-04-03 ENCOUNTER — Other Ambulatory Visit: Payer: Self-pay

## 2022-04-03 ENCOUNTER — Other Ambulatory Visit (HOSPITAL_COMMUNITY): Payer: Self-pay | Admitting: Surgery

## 2022-04-03 DIAGNOSIS — I10 Essential (primary) hypertension: Secondary | ICD-10-CM | POA: Insufficient documentation

## 2022-04-10 ENCOUNTER — Other Ambulatory Visit: Payer: Self-pay | Admitting: Nurse Practitioner

## 2022-04-16 ENCOUNTER — Encounter: Payer: Self-pay | Admitting: Nurse Practitioner

## 2022-04-16 ENCOUNTER — Encounter: Payer: No Typology Code available for payment source | Attending: Surgery | Admitting: Skilled Nursing Facility1

## 2022-04-16 DIAGNOSIS — Z6841 Body Mass Index (BMI) 40.0 and over, adult: Secondary | ICD-10-CM | POA: Insufficient documentation

## 2022-04-16 NOTE — Progress Notes (Signed)
Nutrition Assessment for Bariatric Surgery Medical Nutrition Therapy  Patient was seen on 04/17/2022 for Pre-Operative Nutrition Assessment. Letter of approval faxed to [facility] bariatric surgery program coordinator on 04/17/2022.   Referral stated Supervised Weight Loss (SWL) visits needed: 0  Pt completed visits.   Pt has cleared nutrition requirements.   Planned surgery: Sleeve Pt expectation of surgery: decrease appitite lose wt and increase activity  Pt expectation of dietitian: none indicated    NUTRITION ASSESSMENT   Anthropometrics  Start weight at [facility]: 337 lbs (date: 04/17/2022)  Height: 64 in BMI: 57.97 kg/m2     Clinical  Medical hx: HTN, Major depressive disorder, GAD, ADHD Medications: see list  Labs: LDL 112, vitamin D 24.6 Notable signs/symptoms: none identified  Any previous deficiencies? No  Micronutrient Nutrition Focused Physical Exam: Hair: No issues observed Eyes: No issues observed Mouth: No issues observed Neck: No issues observed Nails: No issues observed Skin: No issues observed  Lifestyle & Dietary Hx  Pt states she has back issues which limits her activity Pt states she worked with a Paramedic in [place], and states she is looking for one here locally.  Pt states she feels guilty after eating.  Stating she feels guilty when she eats too much 2 to 3 times a week.  States she is not paying attention.  Pt states if she portioned she would have control. Pt states she does well with direction.  Pt states she believes if she is more aware she will be able to have control over these behaviors.   24-Hr Dietary Recall First Meal: greek yogurt with coffee Snack: pretzels Second Meal: skip or sandwich and wheat thins, or spinach dip or  Snack: Wheat thins Third Meal: chicken and rice and grilled vegetables or grilled salmon Snack: pretzels Wheat thins Beverages: Coffee, water, zero sugar Dr. Reino Kent, Verdis Frederickson Southern Sweet Tea with  splenda, milk   Estimated Energy Needs 1600   NUTRITION DIAGNOSIS  Overweight/obesity ([place]-3.3) related to past poor dietary habits and physical inactivity as evidenced by patient w/ planned sleeve gastrectomy surgery following dietary guidelines for continued weight loss.    NUTRITION INTERVENTION  Nutrition counseling (C-1) and education (E-2) to facilitate bariatric surgery goals.  Educated pt on micronutrient deficiencies post surgery and strategies to mitigate that risk  Discussed the importance of understanding her relationship with food, and events that lead up to loss of control eating/disordered eating patterns   Pre-Op Goals Reviewed with the Patient Track food and beverage intake (pen and paper, MyFitness Pal, Baritastic app, etc.) Make healthy food choices while monitoring portion sizes Consume 3 meals per day or try to eat every 3-5 hours Avoid concentrated sugars and fried foods Keep sugar & fat in the single digits per serving on food labels Practice CHEWING your food (aim for applesauce consistency) Practice not drinking 15 minutes before, during, and 30 minutes after each meal and snack Avoid all carbonated beverages (ex: soda, sparkling beverages)  Limit caffeinated beverages (ex: coffee, tea, energy drinks) Avoid all sugar-sweetened beverages (ex: regular soda, sports drinks)  Avoid alcohol  Aim for 64-100 ounces of FLUID daily (with at least half of fluid intake being plain water)  Aim for at least 60-80 grams of PROTEIN daily Look for a liquid protein source that contains ?15 g protein and ?5 g carbohydrate (ex: shakes, drinks, shots) Make a list of non-food related activities Physical activity is an important part of a healthy lifestyle so keep it moving! The goal is to  reach 150 minutes of exercise per week, including cardiovascular and weight baring activity.  *Goals that are bolded indicate the pt would like to start working towards these  Handouts  Provided Include  Bariatric Surgery handouts (Nutrition Visits, Pre-Op Goals, Protein Shakes, Vitamins & Minerals)  Learning Style & Readiness for Change Teaching method utilized: Visual & Auditory  Demonstrated degree of understanding via: Teach Back  Readiness Level: contemplative  Barriers to learning/adherence to lifestyle change: none identified      MONITORING & EVALUATION Dietary intake, weekly physical activity, body weight, and pre-op goals reached at next nutrition visit.    Next Steps  Patient is to follow up at [facility] for Pre-Op Class >2 weeks before surgery for further nutrition education.  Pt has completed visits. No further supervised visits required/recomended

## 2022-04-17 ENCOUNTER — Encounter: Payer: Self-pay | Admitting: Skilled Nursing Facility1

## 2022-05-25 ENCOUNTER — Ambulatory Visit: Payer: Self-pay | Admitting: Surgery

## 2022-05-25 ENCOUNTER — Ambulatory Visit: Payer: 59 | Admitting: Advanced Practice Midwife

## 2022-05-25 NOTE — H&P (View-Only) (Signed)
Morgan Romero [id number]   Referring Provider: Self  Chief Complaint: New Weight Loss  History of Present Illness: Follow-up regarding surgical management of morbid obesity.  She has completed the bariatric pathway with no barriers identified.  Surgery is planned for the end of July.  She has been cleared by the dietitian.  Chest x-ray and upper GI were negative, no hiatal hernia or reflux.   Initial visit 03/13/2022 : this is a very pleasant 46 year old woman who presents for consultation regarding surgical management of morbid obesity. 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 She reports a strong family history of obesity. She has tried numerous diets and lifestyle changes in order to treat this, and reports that she has sought medical treatment with different medications in the past but that her previous physicians had not really been willing to initiate that. Subsequently she has continued to gain weight. She moved back to [place] recently and establish care with a new primary care doctor who she speaks very highly of, and has been working aggressively to treat this.  She is interested in sleeve gastrectomy, and is fairly well educated about the topic. Endorses a good understanding of the long-term lifestyle changes that will be necessary to have sustained success.  Review of Systems: A complete review of systems was obtained from the patient. I have reviewed this information and discussed as appropriate with the patient. See HPI as well for other ROS.  Medical History: Past Medical History:  Diagnosis Date   Anxiety   Hypertension   There is no problem list on file for this patient.  Past Surgical History:  Procedure Laterality Date   Carpal Tunnel  Surgery 12/2021    No Known Allergies  Current Outpatient Medications on File Prior to Visit  Medication Sig Dispense Refill   ALPRAZolam (XANAX) 0.5 MG tablet Take by mouth at bedtime as needed   dextroamphetamine-amphetamine (ADDERALL) 20 mg tablet Take 20 mg by mouth once daily   buPROPion (WELLBUTRIN XL) 150 MG XL tablet   ibuprofen (MOTRIN) 200 MG tablet Take by mouth   lisinopriL-hydroCHLOROthiazide (ZESTORETIC) 10-12.5 mg tablet Take 1 tablet by mouth once daily   No current facility-administered medications on file prior to visit.   History reviewed. No pertinent family history.   Social History   Tobacco Use  Smoking Status Former   Types: Cigarettes   Quit date: 2020   Years since quitting: 3.2  Smokeless Tobacco Never    Social History   Socioeconomic History   Marital status: Single  Tobacco Use   Smoking status: Former  Types: Cigarettes  Quit date: 2020  Years since quitting: 3.2   Smokeless tobacco: Never  Vaping Use   Vaping Use: Unknown  Substance and Sexual Activity   Alcohol use: Yes   Drug use: Never   Objective:   Vitals:  03/13/22 0936  BP: (!) 142/72  Pulse: 107  Temp: 37.1 C (98.7 F)  SpO2: 98%  Weight: (!) 153.7 kg (338 lb 12.8 oz)  Height: 162.6 cm (5\' 4" )   Body mass index is 58.15 kg/m.  Alert and well-appearing Unlabored respirations  Assessment and Plan:   Morbid obesity (CMS-HCC): BMI 30= 175lb Obsessive-compulsive disorder, unspecified type History of depression Bipolar  affective disorder, remission status unspecified (CMS-HCC) Anxiety Back pain, unspecified back location, unspecified back pain laterality, unspecified chronicity Attention deficit hyperactivity disorder (ADHD), unspecified ADHD type History of carpal tunnel syndrome  She remains a good candidate for sleeve gastrectomy. We have previously discussed the surgery including technical aspects, the risks of bleeding, infection, pain, scarring, injury to  intra-abdominal structures, staple line leak or abscess, chronic abdominal pain or nausea, new onset or worsened GERD, DVT/PE, pneumonia, heart attack, stroke, death, failure to reach weight loss goals and weight regain, hernia. Discussed the typical peri-, and postoperative course. Discussed the importance of lifelong behavioral changes to combat the chronic and relapsing disease which is obesity. We will plan to proceed as scheduled this month.

## 2022-05-25 NOTE — H&P (Signed)
Morgan Romero [id number]   Referring Provider: Self  Chief Complaint: New Weight Loss  History of Present Illness: Follow-up regarding surgical management of morbid obesity.  She has completed the bariatric pathway with no barriers identified.  Surgery is planned for the end of July.  She has been cleared by the dietitian.  Chest x-ray and upper GI were negative, no hiatal hernia or reflux.   Initial visit 03/13/2022 : this is a very pleasant 46-year-old woman who presents for consultation regarding surgical management of morbid obesity. 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 She reports a strong family history of obesity. She has tried numerous diets and lifestyle changes in order to treat this, and reports that she has sought medical treatment with different medications in the past but that her previous physicians had not really been willing to initiate that. Subsequently she has continued to gain weight. She moved back to [place] recently and establish care with a new primary care doctor who she speaks very highly of, and has been working aggressively to treat this.  She is interested in sleeve gastrectomy, and is fairly well educated about the topic. Endorses a good understanding of the long-term lifestyle changes that will be necessary to have sustained success.  Review of Systems: A complete review of systems was obtained from the patient. I have reviewed this information and discussed as appropriate with the patient. See HPI as well for other ROS.  Medical History: Past Medical History:  Diagnosis Date   Anxiety   Hypertension   There is no problem list on file for this patient.  Past Surgical History:  Procedure Laterality Date   Carpal Tunnel  Surgery 12/2021    No Known Allergies  Current Outpatient Medications on File Prior to Visit  Medication Sig Dispense Refill   ALPRAZolam (XANAX) 0.5 MG tablet Take by mouth at bedtime as needed   dextroamphetamine-amphetamine (ADDERALL) 20 mg tablet Take 20 mg by mouth once daily   buPROPion (WELLBUTRIN XL) 150 MG XL tablet   ibuprofen (MOTRIN) 200 MG tablet Take by mouth   lisinopriL-hydroCHLOROthiazide (ZESTORETIC) 10-12.5 mg tablet Take 1 tablet by mouth once daily   No current facility-administered medications on file prior to visit.   History reviewed. No pertinent family history.   Social History   Tobacco Use  Smoking Status Former   Types: Cigarettes   Quit date: 2020   Years since quitting: 3.2  Smokeless Tobacco Never    Social History   Socioeconomic History   Marital status: Single  Tobacco Use   Smoking status: Former  Types: Cigarettes  Quit date: 2020  Years since quitting: 3.2   Smokeless tobacco: Never  Vaping Use   Vaping Use: Unknown  Substance and Sexual Activity   Alcohol use: Yes   Drug use: Never   Objective:   Vitals:  03/13/22 0936  BP: (!) 142/72  Pulse: 107  Temp: 37.1 C (98.7 F)  SpO2: 98%  Weight: (!) 153.7 kg (338 lb 12.8 oz)  Height: 162.6 cm (5' 4")   Body mass index is 58.15 kg/m.  Alert and well-appearing Unlabored respirations  Assessment and Plan:   Morbid obesity (CMS-HCC): BMI 30= 175lb Obsessive-compulsive disorder, unspecified type History of depression Bipolar   affective disorder, remission status unspecified (CMS-HCC) Anxiety Back pain, unspecified back location, unspecified back pain laterality, unspecified chronicity Attention deficit hyperactivity disorder (ADHD), unspecified ADHD type History of carpal tunnel syndrome  She remains a good candidate for sleeve gastrectomy. We have previously discussed the surgery including technical aspects, the risks of bleeding, infection, pain, scarring, injury to  intra-abdominal structures, staple line leak or abscess, chronic abdominal pain or nausea, new onset or worsened GERD, DVT/PE, pneumonia, heart attack, stroke, death, failure to reach weight loss goals and weight regain, hernia. Discussed the typical peri-, and postoperative course. Discussed the importance of lifelong behavioral changes to combat the chronic and relapsing disease which is obesity. We will plan to proceed as scheduled this month.

## 2022-05-29 ENCOUNTER — Ambulatory Visit: Payer: 59 | Admitting: Nurse Practitioner

## 2022-06-01 ENCOUNTER — Encounter: Payer: No Typology Code available for payment source | Attending: Surgery | Admitting: Skilled Nursing Facility1

## 2022-06-01 ENCOUNTER — Encounter: Payer: Self-pay | Admitting: Skilled Nursing Facility1

## 2022-06-01 DIAGNOSIS — Z6841 Body Mass Index (BMI) 40.0 and over, adult: Secondary | ICD-10-CM | POA: Diagnosis not present

## 2022-06-01 DIAGNOSIS — Z713 Dietary counseling and surveillance: Secondary | ICD-10-CM | POA: Insufficient documentation

## 2022-06-01 NOTE — Progress Notes (Signed)
Pre-Operative Nutrition Class:    Patient was seen on 06/01/2022 for Pre-Operative Bariatric Surgery Education [facility]    Surgery date: 06/22/2022 Surgery type: sleeve Start weight at [facility]: 337 Weight today: 343.3  Samples given per MNT protocol. Patient educated on appropriate usage:  Bariatric Advantage Multivitamin Lot # [id number] Exp:08/24   Bariatric Advantage Calcium  Lot #[id number] Exp: 10/05/2022   Protein Shake Lot # 01/24 Exp: [id number]  The following the learning objectives were met by the patient during this course: Identify Pre-Op Dietary Goals and will begin 2 weeks pre-operatively Identify appropriate sources of fluids and proteins  State protein recommendations and appropriate sources pre and post-operatively Identify Post-Operative Dietary Goals and will follow for 2 weeks post-operatively Identify appropriate multivitamin and calcium sources Describe the need for physical activity post-operatively and will follow MD recommendations State when to call healthcare provider regarding medication questions or post-operative complications When having a diagnosis of diabetes understanding hypoglycemia symptoms and the inclusion of 1 complex carbohydrate per meal  Handouts given during class include: Pre-Op Bariatric Surgery Diet Handout Protein Shake Handout Post-Op Bariatric Surgery Nutrition Handout BELT Program Information Flyer Support Group Information Flyer [facility] Bariatric Supplements Price List  Follow-Up Plan: Patient will follow-up at [facility] 2 weeks post operatively for diet advancement per MD.

## 2022-06-04 ENCOUNTER — Ambulatory Visit (INDEPENDENT_AMBULATORY_CARE_PROVIDER_SITE_OTHER): Payer: No Typology Code available for payment source | Admitting: Nurse Practitioner

## 2022-06-04 VITALS — BP 120/78 | HR 91 | Temp 98.5°F | Ht 64.0 in | Wt 336.0 lb

## 2022-06-04 DIAGNOSIS — Z6841 Body Mass Index (BMI) 40.0 and over, adult: Secondary | ICD-10-CM

## 2022-06-04 DIAGNOSIS — F429 Obsessive-compulsive disorder, unspecified: Secondary | ICD-10-CM

## 2022-06-04 DIAGNOSIS — F419 Anxiety disorder, unspecified: Secondary | ICD-10-CM | POA: Diagnosis not present

## 2022-06-04 DIAGNOSIS — F909 Attention-deficit hyperactivity disorder, unspecified type: Secondary | ICD-10-CM | POA: Diagnosis not present

## 2022-06-04 DIAGNOSIS — I10 Essential (primary) hypertension: Secondary | ICD-10-CM

## 2022-06-04 NOTE — Progress Notes (Signed)
Established Patient Office Visit  Subjective   Patient ID: Morgan Romero, female    DOB: 07/23/1976  Age: 46 y.o. MRN: [id number]  Chief Complaint  Patient presents with   16mo follow up    No concerns   Obesity   ADHD    Patient has upcoming bariatric sleeve surgery later this month.  She is currently on Wellbutrin for treatment of ADHD, anxiety, and OCD.  She would like to restart stimulant such as Adderall.  She reports being on this for years, but has not been on it here recently.  She is not currently seeing a psychiatrist but she does undergo counseling on a regular basis.  Per chart review last metabolic panel showed mild hypokalemia.  She is on potassium supplementation.  She is scheduled to have preop blood work drawn next week including metabolic panel.  Overall she feels well.    Review of Systems  Constitutional:  Negative for fever and malaise/fatigue.  Respiratory:  Negative for shortness of breath.   Cardiovascular:  Negative for chest pain and palpitations.  Neurological:  Negative for dizziness and headaches.      Objective:     BP 120/78 (BP Location: Right Arm, Patient Position: Sitting, Cuff Size: Large)   Pulse 91   Temp 98.5 F (36.9 C) (Oral)   Ht 5\' 4"  (1.626 m)   Wt (!) 336 lb (152.4 kg)   SpO2 96%   BMI 57.67 kg/m  BP Readings from Last 3 Encounters:  06/04/22 120/78  02/27/22 132/80  01/30/22 (!) 150/88   Wt Readings from Last 3 Encounters:  06/04/22 (!) 336 lb (152.4 kg)  06/01/22 (!) 343 lb 4.8 oz (155.7 kg)  04/16/22 (!) 337 lb 11.2 oz (153.2 kg)      Physical Exam Vitals reviewed.  Constitutional:      General: She is not in acute distress.    Appearance: Normal appearance.  HENT:     Head: Normocephalic and atraumatic.  Neck:     Vascular: No carotid bruit.  Cardiovascular:     Rate and Rhythm: Normal rate and regular rhythm.     Pulses: Normal pulses.     Heart sounds: Normal heart sounds.  Pulmonary:      Effort: Pulmonary effort is normal.     Breath sounds: Normal breath sounds.  Skin:    General: Skin is warm and dry.  Neurological:     General: No focal deficit present.     Mental Status: She is alert and oriented to person, place, and time.  Psychiatric:        Mood and Affect: Mood normal.        Behavior: Behavior normal.        Judgment: Judgment normal.      No results found for any visits on 06/04/22.    The 10-year ASCVD risk score (Arnett DK, et al., 2019) is: 2.8%    Assessment & Plan:   Problem List Items Addressed This Visit       Cardiovascular and Mediastinum   Hypertension    Chronic, well controlled.  Mild hypokalemia seen on last metabolic panel.  Patient to continue taking potassium supplementation 10 mg/day, will send reminder in system to check metabolic panel that will be drawn in a couple of weeks to make sure she has normalized potassium level.        Other   Class 3 severe obesity with serious comorbidity and body mass index (BMI) of 50.0  to 59.9 in adult [facility])    Chronic, patient preparing to undergo bariatric sleeve surgery.  Patient encouraged to follow-up with bariatric surgeon as scheduled.      Attention deficit hyperactivity disorder (ADHD) - Primary    Chronic, stable.  Patient to continue on Wellbutrin 450mg /day.  Continue counseling services.   May consider treating her for ADHD if she undergoes evaluation with psychiatry initially.  She is agreeable to this. Referral to psychiatry made today as well.      Relevant Orders   Ambulatory referral to Psychiatry   Obsessive-compulsive disorder    Chronic, stable.  Continue Wellbutrin 450 mg/day, continue with counseling services.  Referral to psychiatry made today.      Relevant Orders   Ambulatory referral to Psychiatry   Anxiety    Chronic, stable.  Patient to continue on Wellbutrin 450mg /day.  Continue counseling services.  Referral to psychiatry made today as well.      Relevant  Orders   Ambulatory referral to Psychiatry    Return in about 3 months (around 09/04/2022).    , NP

## 2022-06-04 NOTE — Assessment & Plan Note (Signed)
Chronic, stable.  Patient to continue on Wellbutrin 450mg /day.  Continue counseling services.   May consider treating her for ADHD if she undergoes evaluation with psychiatry initially.  She is agreeable to this. Referral to psychiatry made today as well.

## 2022-06-04 NOTE — Assessment & Plan Note (Addendum)
Chronic, well controlled.  Mild hypokalemia seen on last metabolic panel.  Patient to continue taking potassium supplementation 10 mg/day, will send reminder in system to check metabolic panel that will be drawn in a couple of weeks to make sure she has normalized potassium level.  Continue lisinopril-hydrochlorothiazide 20-12.5mg / daily

## 2022-06-04 NOTE — Assessment & Plan Note (Signed)
Chronic, patient preparing to undergo bariatric sleeve surgery.  Patient encouraged to follow-up with bariatric surgeon as scheduled.

## 2022-06-04 NOTE — Assessment & Plan Note (Addendum)
Chronic, stable.  Patient to continue on Wellbutrin 450mg /day.  Continue counseling services.  Referral to psychiatry made today as well.

## 2022-06-04 NOTE — Assessment & Plan Note (Signed)
Chronic, stable.  Continue Wellbutrin 450 mg/day, continue with counseling services.  Referral to psychiatry made today.

## 2022-06-09 ENCOUNTER — Other Ambulatory Visit (HOSPITAL_COMMUNITY): Payer: Self-pay

## 2022-06-11 NOTE — Progress Notes (Addendum)
COVID Vaccine Completed: x2  Date of COVID positive in last 41 days:no  PCP - Jiles Prows, NP Cardiologist - n/a  Chest x-ray - 04/03/22 Epic EKG - 04/03/22 Epic Stress Test - n/a ECHO - n/a Cardiac Cath - n/a Pacemaker/ICD device last checked: n/a Spinal Cord Stimulator: n/a  Bowel Prep - no solids after 6pm the night before  Sleep Study - n/a CPAP -   Fasting Blood Sugar - n/a Checks Blood Sugar _____ times a day  Blood Thinner Instructions: n.a Aspirin Instructions: Last Dose:  Activity level: Can go up a flight of stairs and perform activities of daily living without stopping and without symptoms of chest pain or shortness of breath.  Anesthesia review:   Patient denies shortness of breath, fever, cough and chest pain at PAT appointment  Patient verbalized understanding of instructions that were given to them at the PAT appointment. Patient was also instructed that they will need to review over the PAT instructions again at home before surgery.

## 2022-06-11 NOTE — Patient Instructions (Addendum)
DUE TO COVID-19 ONLY TWO VISITORS  (aged 45 and older)  ARE ALLOWED TO COME WITH YOU AND STAY IN THE WAITING ROOM ONLY DURING PRE OP AND PROCEDURE.   **NO VISITORS ARE ALLOWED IN THE SHORT STAY AREA OR RECOVERY ROOM!!**  IF YOU WILL BE ADMITTED INTO THE HOSPITAL YOU ARE ALLOWED ONLY FOUR SUPPORT PEOPLE DURING VISITATION HOURS ONLY (7 AM -8PM)   The support person(s) must pass our screening, gel in and out, and wear a mask at all times, including in the patient's room. Patients must also wear a mask when staff or their support person are in the room. Visitors GUEST BADGE MUST BE WORN VISIBLY  One adult visitor may remain with you overnight and MUST be in the room by 8 P.M.     Your procedure is scheduled on: 06/22/22   Report to [facility] Main Entrance    Report to admitting at 7:30 AM   Call this number if you have problems the morning of surgery [phone]   MORNING OF SURGERY DRINK:   DRINK 1 G2 drink BEFORE YOU LEAVE HOME, DRINK ALL OF THE  G2 DRINK AT ONE TIME.   NO SOLID FOOD AFTER 600 PM THE NIGHT BEFORE YOUR SURGERY. YOU MAY DRINK CLEAR FLUIDS. THE G2 DRINK YOU DRINK BEFORE YOU LEAVE HOME WILL BE THE LAST FLUIDS YOU DRINK BEFORE SURGERY.  PAIN IS EXPECTED AFTER SURGERY AND WILL NOT BE COMPLETELY ELIMINATED. AMBULATION AND TYLENOL WILL HELP REDUCE INCISIONAL AND GAS PAIN. MOVEMENT IS KEY!  YOU ARE EXPECTED TO BE OUT OF BED WITHIN 4 HOURS OF ADMISSION TO YOUR PATIENT ROOM.  SITTING IN THE RECLINER THROUGHOUT THE DAY IS IMPORTANT FOR DRINKING FLUIDS AND MOVING GAS THROUGHOUT THE GI TRACT.  COMPRESSION STOCKINGS SHOULD BE WORN [name] UNLESS YOU ARE WALKING.   INCENTIVE SPIROMETER SHOULD BE USED EVERY HOUR WHILE AWAKE TO DECREASE POST-OPERATIVE COMPLICATIONS SUCH AS PNEUMONIA.  WHEN DISCHARGED HOME, IT IS IMPORTANT TO CONTINUE TO WALK EVERY HOUR AND USE THE INCENTIVE SPIROMETER EVERY HOUR.    You may have the following liquids until 6:45 AM DAY OF  SURGERY  Water Black Coffee (sugar ok, NO MILK/CREAM OR CREAMERS)  Tea (sugar ok, NO MILK/CREAM OR CREAMERS) regular and decaf                             Plain Jell-O (NO RED)                                           Fruit ices (not with fruit pulp, NO RED)                                     Popsicles (NO RED)                                                                  Juice: apple, WHITE grape, WHITE cranberry Sports drinks like Gatorade (NO RED) Clear broth(vegetable,chicken,beef)  The day of surgery:  Drink ONE (1) Pre-Surgery G2 at 6:45 AM the morning of surgery. Drink in one sitting. Do not sip.  This drink was given to you during your hospital  pre-op appointment visit. Nothing else to drink after completing the  Pre-Surgery G2.          If you have questions, please contact your surgeon's office.   FOLLOW BOWEL PREP AND ANY ADDITIONAL PRE OP INSTRUCTIONS YOU RECEIVED FROM YOUR SURGEON'S OFFICE!!!     Oral Hygiene is also important to reduce your risk of infection.                                    Remember - BRUSH YOUR TEETH THE MORNING OF SURGERY WITH YOUR REGULAR TOOTHPASTE   Take these medicines the morning of surgery with A SIP OF WATER: Tylenol, Xanax, Bupropion, Allegra                              You may not have any metal on your body including hair pins, jewelry, and body piercing             Do not wear make-up, lotions, powders, perfumes, or deodorant  Do not wear nail polish including gel and S&S, artificial/acrylic nails, or any other type of covering on natural nails including finger and toenails. If you have artificial nails, gel coating, etc. that needs to be removed by a nail salon please have this removed prior to surgery or surgery may need to be canceled/ delayed if the surgeon/ anesthesia feels like they are unable to be safely monitored.   Do not shave  48 hours prior to surgery.    Do not bring valuables to the hospital.  [name]   Contacts, dentures or bridgework may not be worn into surgery.   Bring small overnight bag day of surgery.   DO NOT [name] PHARMACY WILL DISPENSE MEDICATIONS LISTED ON YOUR MEDICATION LIST TO YOU DURING YOUR ADMISSION [name]    Special Instructions: Bring a copy of your healthcare power of attorney and living will documents         the day of surgery if you haven't scanned them before.              Please read over the following fact sheets you were given: IF YOU HAVE QUESTIONS ABOUT YOUR PRE-OP INSTRUCTIONS PLEASE CALL [name] - Preparing for Surgery Before surgery, you can play an important role.  Because skin is not sterile, your skin needs to be as free of germs as possible.  You can reduce the number of germs on your skin by washing with CHG (chlorahexidine gluconate) soap before surgery.  CHG is an antiseptic cleaner which kills germs and bonds with the skin to continue killing germs even after washing. Please DO NOT use if you have an allergy to CHG or antibacterial soaps.  If your skin becomes reddened/irritated stop using the CHG and inform your nurse when you arrive at Short Stay. Do not shave (including legs and underarms) for at least 48 hours prior to the first CHG shower.  You may shave your face/neck.  Please follow  these instructions carefully:  1.  Shower with CHG Soap the night before surgery and the  morning of surgery.  2.  If you choose to wash your hair, wash your hair first as usual with your normal  shampoo.  3.  After you shampoo, rinse your hair and body thoroughly to remove the shampoo.                             4.  Use CHG as you would any other liquid soap.  You can apply chg directly to the skin and wash.  Gently with a scrungie or clean washcloth.  5.  Apply the CHG Soap to your body ONLY FROM THE NECK DOWN.   Do   not use  on face/ open                           Wound or open sores. Avoid contact with eyes, ears mouth and   genitals (private parts).                       Wash face,  Genitals (private parts) with your normal soap.             6.  Wash thoroughly, paying special attention to the area where your    surgery  will be performed.  7.  Thoroughly rinse your body with warm water from the neck down.  8.  DO NOT shower/wash with your normal soap after using and rinsing off the CHG Soap.                9.  Pat yourself dry with a clean towel.            10.  Wear clean pajamas.            11.  Place clean sheets on your bed the night of your first shower and do not  sleep with pets. Day of Surgery : Do not apply any lotions/deodorants the morning of surgery.  Please wear clean clothes to the hospital/surgery center.  FAILURE TO FOLLOW THESE INSTRUCTIONS MAY RESULT IN THE CANCELLATION OF YOUR SURGERY  PATIENT SIGNATURE_________________________________  NURSE SIGNATURE__________________________________  ________________________________________________________________________   Morgan Romero  An incentive spirometer is a tool that can help keep your lungs clear and active. This tool measures how well you are filling your lungs with each breath. Taking long deep breaths may help reverse or decrease the chance of developing breathing (pulmonary) problems (especially infection) following: A long period of time when you are unable to move or be active. BEFORE THE PROCEDURE  If the spirometer includes an indicator to show your best effort, your nurse or respiratory therapist will set it to a desired goal. If possible, sit up straight or lean slightly forward. Try not to slouch. Hold the incentive spirometer in an upright position. INSTRUCTIONS FOR USE  Sit on the edge of your bed if possible, or sit up as far as you can in bed or on a chair. Hold the incentive spirometer in an upright  position. Breathe out normally. Place the mouthpiece in your mouth and seal your lips tightly around it. Breathe in slowly and as deeply as possible, raising the piston or the ball toward the top of the column. Hold your breath for 3-5 seconds or for as long as possible. Allow the piston or ball to fall  to the bottom of the column. Remove the mouthpiece from your mouth and breathe out normally. Rest for a few seconds and repeat Steps 1 through 7 at least 10 times every 1-2 hours when you are awake. Take your time and take a few normal breaths between deep breaths. The spirometer may include an indicator to show your best effort. Use the indicator as a goal to work toward during each repetition. After each set of 10 deep breaths, practice coughing to be sure your lungs are clear. If you have an incision (the cut made at the time of surgery), support your incision when coughing by placing a pillow or rolled up towels firmly against it. Once you are able to get out of bed, walk around indoors and cough well. You may stop using the incentive spirometer when instructed by your caregiver.  RISKS AND COMPLICATIONS Take your time so you do not get dizzy or light-headed. If you are in pain, you may need to take or ask for pain medication before doing incentive spirometry. It is harder to take a deep breath if you are having pain. AFTER USE Rest and breathe slowly and easily. It can be helpful to keep track of a log of your progress. Your caregiver can provide you with a simple table to help with this. If you are using the spirometer at home, follow these instructions: [place] IF:  You are having difficultly using the spirometer. You have trouble using the spirometer as often as instructed. Your pain medication is not giving enough relief while using the spirometer. You develop fever of 100.5 F (38.1 C) or higher. SEEK IMMEDIATE MEDICAL CARE IF:  You cough up bloody sputum that had not been  present before. You develop fever of 102 F (38.9 C) or greater. You develop worsening pain at or near the incision site. MAKE SURE YOU:  Understand these instructions. Will watch your condition. Will get help right away if you are not doing well or get worse. Document Released: 03/29/2007 Document Revised: 02/08/2012 Document Reviewed: 05/30/2007 Exit[employer] Patient Information 2014 [employer], [place].   ________________________________________________________________________  WHAT IS A BLOOD TRANSFUSION? Blood Transfusion Information  A transfusion is the replacement of blood or some of its parts. Blood is made up of multiple cells which provide different functions. Red blood cells carry oxygen and are used for blood loss replacement. White blood cells fight against infection. Platelets control bleeding. Plasma helps clot blood. Other blood products are available for specialized needs, such as hemophilia or other clotting disorders. BEFORE THE TRANSFUSION  Who gives blood for transfusions?  Healthy volunteers who are fully evaluated to make sure their blood is safe. This is blood bank blood. Transfusion therapy is the safest it has ever been in the practice of medicine. Before blood is taken from a donor, a complete history is taken to make sure that person has no history of diseases nor engages in risky social behavior (examples are intravenous drug use or sexual activity with multiple partners). The donor's travel history is screened to minimize risk of transmitting infections, such as malaria. The donated blood is tested for signs of infectious diseases, such as HIV and hepatitis. The blood is then tested to be sure it is compatible with you in order to minimize the chance of a transfusion reaction. If you or a relative donates blood, this is often done in anticipation of surgery and is not appropriate for emergency situations. It takes many days to process the donated blood.  RISKS AND  COMPLICATIONS Although transfusion therapy is very safe and saves many lives, the main dangers of transfusion include:  Getting an infectious disease. Developing a transfusion reaction. This is an allergic reaction to something in the blood you were given. Every precaution is taken to prevent this. The decision to have a blood transfusion has been considered carefully by your caregiver before blood is given. Blood is not given unless the benefits outweigh the risks. AFTER THE TRANSFUSION Right after receiving a blood transfusion, you will usually feel much better and more energetic. This is especially true if your red blood cells have gotten low (anemic). The transfusion raises the level of the red blood cells which carry oxygen, and this usually causes an energy increase. The nurse administering the transfusion will monitor you carefully for complications. HOME CARE INSTRUCTIONS  No special instructions are needed after a transfusion. You may find your energy is better. Speak with your caregiver about any limitations on activity for underlying diseases you may have. SEEK MEDICAL CARE IF:  Your condition is not improving after your transfusion. You develop redness or irritation at the intravenous (IV) site. SEEK IMMEDIATE MEDICAL CARE IF:  Any of the following symptoms occur over the next 12 hours: Shaking chills. You have a temperature by mouth above 102 F (38.9 C), not controlled by medicine. Chest, back, or muscle pain. People around you feel you are not acting correctly or are confused. Shortness of breath or difficulty breathing. Dizziness and fainting. You get a rash or develop hives. You have a decrease in urine output. Your urine turns a dark color or changes to pink, red, or brown. Any of the following symptoms occur over the next 10 days: You have a temperature by mouth above 102 F (38.9 C), not controlled by medicine. Shortness of breath. Weakness after normal activity. The  white part of the eye turns yellow (jaundice). You have a decrease in the amount of urine or are urinating less often. Your urine turns a dark color or changes to pink, red, or brown. Document Released: 11/13/2000 Document Revised: 02/08/2012 Document Reviewed: 07/02/2008 [facility] Patient Information 2014 [place], [place].  _______________________________________________________________________

## 2022-06-12 ENCOUNTER — Encounter (HOSPITAL_COMMUNITY)
Admission: RE | Admit: 2022-06-12 | Discharge: 2022-06-12 | Disposition: A | Discharge status: Home / Self Care | Payer: No Typology Code available for payment source | Source: Ambulatory Visit | Attending: Surgery | Admitting: Surgery

## 2022-06-12 ENCOUNTER — Encounter (HOSPITAL_COMMUNITY): Payer: Self-pay

## 2022-06-12 DIAGNOSIS — Z6841 Body Mass Index (BMI) 40.0 and over, adult: Secondary | ICD-10-CM | POA: Diagnosis not present

## 2022-06-12 DIAGNOSIS — Z01812 Encounter for preprocedural laboratory examination: Secondary | ICD-10-CM | POA: Insufficient documentation

## 2022-06-12 HISTORY — DX: Gastro-esophageal reflux disease without esophagitis: K21.9

## 2022-06-12 HISTORY — DX: Essential (primary) hypertension: I10

## 2022-06-12 HISTORY — DX: Vitamin D deficiency, unspecified: E55.9

## 2022-06-12 HISTORY — DX: Anemia, unspecified: D64.9

## 2022-06-12 LAB — CBC WITH DIFFERENTIAL/PLATELET
Abs Immature Granulocytes: 0.04 10*3/uL (ref 0.00–0.07)
Basophils Absolute: 0 10*3/uL (ref 0.0–0.1)
Basophils Relative: 1 %
Eosinophils Absolute: 0.1 10*3/uL (ref 0.0–0.5)
Eosinophils Relative: 1 %
HCT: 41.7 % (ref 36.0–46.0)
Hemoglobin: 13.5 g/dL (ref 12.0–15.0)
Immature Granulocytes: 1 %
Lymphocytes Relative: 32 %
Lymphs Abs: 2.7 10*3/uL (ref 0.7–4.0)
MCH: 28.2 pg (ref 26.0–34.0)
MCHC: 32.4 g/dL (ref 30.0–36.0)
MCV: 87.2 fL (ref 80.0–100.0)
Monocytes Absolute: 0.5 10*3/uL (ref 0.1–1.0)
Monocytes Relative: 6 %
Neutro Abs: 5 10*3/uL (ref 1.7–7.7)
Neutrophils Relative %: 59 %
Platelets: 210 10*3/uL (ref 150–400)
RBC: 4.78 MIL/uL (ref 3.87–5.11)
RDW: 13.5 % (ref 11.5–15.5)
WBC: 8.3 10*3/uL (ref 4.0–10.5)
nRBC: 0 % (ref 0.0–0.2)

## 2022-06-12 LAB — COMPREHENSIVE METABOLIC PANEL
ALT: 24 U/L (ref 0–44)
AST: 25 U/L (ref 15–41)
Albumin: 4 g/dL (ref 3.5–5.0)
Alkaline Phosphatase: 52 U/L (ref 38–126)
Anion gap: 6 (ref 5–15)
BUN: 18 mg/dL (ref 6–20)
CO2: 26 mmol/L (ref 22–32)
Calcium: 9.5 mg/dL (ref 8.9–10.3)
Chloride: 105 mmol/L (ref 98–111)
Creatinine, Ser: 1.13 mg/dL — ABNORMAL HIGH (ref 0.44–1.00)
GFR, Estimated: >60 mL/min (ref >60)
Glucose, Bld: 85 mg/dL (ref 70–99)
Potassium: 4 mmol/L (ref 3.5–5.1)
Sodium: 137 mmol/L (ref 135–145)
Total Bilirubin: 0.8 mg/dL (ref 0.3–1.2)
Total Protein: 7.9 g/dL (ref 6.5–8.1)

## 2022-06-22 ENCOUNTER — Inpatient Hospital Stay (HOSPITAL_COMMUNITY)
Admission: RE | Admit: 2022-06-22 | Discharge: 2022-06-23 | DRG: 621 | Disposition: A | Discharge status: Home / Self Care | Payer: No Typology Code available for payment source | Source: Ambulatory Visit | Attending: Surgery | Admitting: Surgery

## 2022-06-22 ENCOUNTER — Encounter (HOSPITAL_COMMUNITY)
Admission: RE | Disposition: A | Discharge status: Home / Self Care | Payer: Self-pay | Source: Ambulatory Visit | Attending: Surgery

## 2022-06-22 ENCOUNTER — Inpatient Hospital Stay (HOSPITAL_COMMUNITY): Payer: No Typology Code available for payment source | Admitting: Anesthesiology

## 2022-06-22 ENCOUNTER — Other Ambulatory Visit (HOSPITAL_COMMUNITY): Payer: Self-pay

## 2022-06-22 ENCOUNTER — Other Ambulatory Visit: Payer: Self-pay

## 2022-06-22 ENCOUNTER — Encounter (HOSPITAL_COMMUNITY): Payer: Self-pay | Admitting: Surgery

## 2022-06-22 DIAGNOSIS — F909 Attention-deficit hyperactivity disorder, unspecified type: Secondary | ICD-10-CM | POA: Diagnosis present

## 2022-06-22 DIAGNOSIS — Z87891 Personal history of nicotine dependence: Secondary | ICD-10-CM

## 2022-06-22 DIAGNOSIS — I10 Essential (primary) hypertension: Secondary | ICD-10-CM | POA: Diagnosis present

## 2022-06-22 DIAGNOSIS — Z91018 Allergy to other foods: Secondary | ICD-10-CM

## 2022-06-22 DIAGNOSIS — F319 Bipolar disorder, unspecified: Secondary | ICD-10-CM | POA: Diagnosis present

## 2022-06-22 DIAGNOSIS — F429 Obsessive-compulsive disorder, unspecified: Secondary | ICD-10-CM | POA: Diagnosis present

## 2022-06-22 DIAGNOSIS — Z79899 Other long term (current) drug therapy: Secondary | ICD-10-CM | POA: Diagnosis not present

## 2022-06-22 DIAGNOSIS — Z6841 Body Mass Index (BMI) 40.0 and over, adult: Secondary | ICD-10-CM | POA: Diagnosis not present

## 2022-06-22 DIAGNOSIS — F419 Anxiety disorder, unspecified: Secondary | ICD-10-CM | POA: Diagnosis present

## 2022-06-22 HISTORY — PX: UPPER GI ENDOSCOPY: SHX6162

## 2022-06-22 HISTORY — PX: LAPAROSCOPIC GASTRIC SLEEVE RESECTION: SHX5895

## 2022-06-22 LAB — TYPE AND SCREEN
ABO/RH(D): B POS
Antibody Screen: NEGATIVE

## 2022-06-22 LAB — HCG, SERUM, QUALITATIVE: Preg, Serum: NEGATIVE

## 2022-06-22 LAB — ABO/RH: ABO/RH(D): B POS

## 2022-06-22 SURGERY — GASTRECTOMY, SLEEVE, LAPAROSCOPIC
Anesthesia: General

## 2022-06-22 MED ORDER — MIDAZOLAM HCL 5 MG/5ML IJ SOLN
INTRAMUSCULAR | Status: DC | PRN
  Administered 2022-06-22: 2 mg via INTRAVENOUS

## 2022-06-22 MED ORDER — LACTATED RINGERS IR SOLN
Status: DC | PRN
  Administered 2022-06-22: 1000 mL

## 2022-06-22 MED ORDER — ENSURE MAX PROTEIN PO LIQD
2.0000 [oz_av] | ORAL | Status: DC
  Administered 2022-06-23 (×2): 2 [oz_av] via ORAL

## 2022-06-22 MED ORDER — PROPOFOL 10 MG/ML IV BOLUS
INTRAVENOUS | Status: DC | PRN
  Administered 2022-06-22: 200 mg via INTRAVENOUS

## 2022-06-22 MED ORDER — BUPIVACAINE-EPINEPHRINE 0.25% -1:200000 IJ SOLN
INTRAMUSCULAR | Status: DC | PRN
  Administered 2022-06-22: 30 mL

## 2022-06-22 MED ORDER — PHENYLEPHRINE 80 MCG/ML (10ML) SYRINGE FOR IV PUSH (FOR BLOOD PRESSURE SUPPORT)
PREFILLED_SYRINGE | INTRAVENOUS | Status: DC | PRN
  Administered 2022-06-22 (×2): 80 ug via INTRAVENOUS

## 2022-06-22 MED ORDER — ROCURONIUM BROMIDE 10 MG/ML (PF) SYRINGE
PREFILLED_SYRINGE | INTRAVENOUS | Status: DC | PRN
  Administered 2022-06-22: 100 mg via INTRAVENOUS

## 2022-06-22 MED ORDER — PHENYLEPHRINE HCL-NACL 20-0.9 MG/250ML-% IV SOLN
INTRAVENOUS | Status: DC | PRN
  Administered 2022-06-22: 35 ug/min via INTRAVENOUS

## 2022-06-22 MED ORDER — DEXAMETHASONE SODIUM PHOSPHATE 10 MG/ML IJ SOLN
INTRAMUSCULAR | Status: AC
  Filled 2022-06-22: qty 1

## 2022-06-22 MED ORDER — ACETAMINOPHEN 160 MG/5ML PO SOLN
1000.0000 mg | Freq: Three times a day (TID) | ORAL | Status: DC
Start: 2022-06-22 — End: 2022-06-23

## 2022-06-22 MED ORDER — HYDRALAZINE HCL 20 MG/ML IJ SOLN: 10.0000 mg | INTRAMUSCULAR | Status: DC | PRN

## 2022-06-22 MED ORDER — BUPIVACAINE LIPOSOME 1.3 % IJ SUSP
INTRAMUSCULAR | Status: AC
  Filled 2022-06-22: qty 20

## 2022-06-22 MED ORDER — LIDOCAINE 2% (20 MG/ML) 5 ML SYRINGE
INTRAMUSCULAR | Status: DC | PRN
  Administered 2022-06-22: 1.5 mg/kg/h via INTRAVENOUS
  Administered 2022-06-22: 100 mg via INTRAVENOUS

## 2022-06-22 MED ORDER — ACETAMINOPHEN 500 MG PO TABS
1000.0000 mg | ORAL_TABLET | ORAL | Status: DC
  Filled 2022-06-22: qty 2

## 2022-06-22 MED ORDER — ONDANSETRON HCL 4 MG/2ML IJ SOLN: 4.0000 mg | INTRAMUSCULAR | Status: DC | PRN

## 2022-06-22 MED ORDER — FENTANYL CITRATE PF 50 MCG/ML IJ SOSY
PREFILLED_SYRINGE | INTRAMUSCULAR | Status: AC
  Filled 2022-06-22: qty 1

## 2022-06-22 MED ORDER — TRAMADOL HCL 50 MG PO TABS: 50.0000 mg | ORAL_TABLET | Freq: Four times a day (QID) | ORAL | Status: DC | PRN

## 2022-06-22 MED ORDER — BUPIVACAINE LIPOSOME 1.3 % IJ SUSP: 20.0000 mL | Freq: Once | INTRAMUSCULAR | Status: DC

## 2022-06-22 MED ORDER — LIDOCAINE HCL (PF) 2 % IJ SOLN
INTRAMUSCULAR | Status: AC
  Filled 2022-06-22: qty 5

## 2022-06-22 MED ORDER — BUPROPION HCL ER (XL) 150 MG PO TB24
450.0000 mg | ORAL_TABLET | Freq: Every day | ORAL | Status: DC
Start: 2022-06-23 — End: 2022-06-23
  Administered 2022-06-23: 450 mg via ORAL
  Filled 2022-06-22: qty 1

## 2022-06-22 MED ORDER — OXYCODONE HCL 5 MG PO TABS: 5.0000 mg | ORAL_TABLET | Freq: Once | ORAL | Status: DC | PRN

## 2022-06-22 MED ORDER — HEPARIN SODIUM (PORCINE) 5000 UNIT/ML IJ SOLN
5000.0000 [IU] | INTRAMUSCULAR | Status: AC
  Administered 2022-06-22: 5000 [IU] via SUBCUTANEOUS
  Filled 2022-06-22: qty 1

## 2022-06-22 MED ORDER — HYDROMORPHONE HCL 1 MG/ML IJ SOLN: 0.5000 mg | INTRAMUSCULAR | Status: DC | PRN

## 2022-06-22 MED ORDER — SIMETHICONE 80 MG PO CHEW: 80.0000 mg | CHEWABLE_TABLET | Freq: Four times a day (QID) | ORAL | Status: DC | PRN

## 2022-06-22 MED ORDER — MIDAZOLAM HCL 2 MG/2ML IJ SOLN
INTRAMUSCULAR | Status: AC
  Filled 2022-06-22: qty 2

## 2022-06-22 MED ORDER — ROCURONIUM BROMIDE 10 MG/ML (PF) SYRINGE
PREFILLED_SYRINGE | INTRAVENOUS | Status: AC
  Filled 2022-06-22: qty 10

## 2022-06-22 MED ORDER — SODIUM CHLORIDE 0.9 % IV SOLN
2.0000 g | INTRAVENOUS | Status: AC
  Administered 2022-06-22: 2 g via INTRAVENOUS
  Filled 2022-06-22: qty 2

## 2022-06-22 MED ORDER — KETOROLAC TROMETHAMINE 15 MG/ML IJ SOLN: 15.0000 mg | Freq: Three times a day (TID) | INTRAMUSCULAR | Status: DC | PRN

## 2022-06-22 MED ORDER — BUPIVACAINE LIPOSOME 1.3 % IJ SUSP
INTRAMUSCULAR | Status: DC | PRN
  Administered 2022-06-22: 20 mL

## 2022-06-22 MED ORDER — FENTANYL CITRATE (PF) 250 MCG/5ML IJ SOLN
INTRAMUSCULAR | Status: DC | PRN
  Administered 2022-06-22 (×3): 50 ug via INTRAVENOUS
  Administered 2022-06-22: 100 ug via INTRAVENOUS

## 2022-06-22 MED ORDER — CHLORHEXIDINE GLUCONATE 4 % EX LIQD: 60.0000 mL | Freq: Once | CUTANEOUS | Status: DC

## 2022-06-22 MED ORDER — CHLORHEXIDINE GLUCONATE 0.12 % MT SOLN
15.0000 mL | Freq: Once | OROMUCOSAL | Status: AC
  Administered 2022-06-22: 15 mL via OROMUCOSAL

## 2022-06-22 MED ORDER — APREPITANT 40 MG PO CAPS
40.0000 mg | ORAL_CAPSULE | ORAL | Status: AC
  Administered 2022-06-22: 40 mg via ORAL
  Filled 2022-06-22: qty 1

## 2022-06-22 MED ORDER — GABAPENTIN 300 MG PO CAPS
300.0000 mg | ORAL_CAPSULE | ORAL | Status: AC
  Administered 2022-06-22: 300 mg via ORAL
  Filled 2022-06-22: qty 1

## 2022-06-22 MED ORDER — ORAL CARE MOUTH RINSE: 15.0000 mL | Freq: Once | OROMUCOSAL | Status: AC

## 2022-06-22 MED ORDER — SODIUM CHLORIDE 0.9 % IV SOLN: INTRAVENOUS | Status: DC

## 2022-06-22 MED ORDER — DOCUSATE SODIUM 100 MG PO CAPS
100.0000 mg | ORAL_CAPSULE | Freq: Two times a day (BID) | ORAL | Status: DC
  Administered 2022-06-22 – 2022-06-23 (×2): 100 mg via ORAL
  Filled 2022-06-22 (×2): qty 1

## 2022-06-22 MED ORDER — DEXAMETHASONE SODIUM PHOSPHATE 10 MG/ML IJ SOLN
INTRAMUSCULAR | Status: DC | PRN
  Administered 2022-06-22: 10 mg via INTRAVENOUS

## 2022-06-22 MED ORDER — PANTOPRAZOLE SODIUM 40 MG IV SOLR
40.0000 mg | Freq: Every day | INTRAVENOUS | Status: DC
  Administered 2022-06-22: 40 mg via INTRAVENOUS
  Filled 2022-06-22: qty 10

## 2022-06-22 MED ORDER — METOCLOPRAMIDE HCL 5 MG/ML IJ SOLN
10.0000 mg | Freq: Four times a day (QID) | INTRAMUSCULAR | Status: DC
  Administered 2022-06-22 – 2022-06-23 (×4): 10 mg via INTRAVENOUS
  Filled 2022-06-22 (×4): qty 2

## 2022-06-22 MED ORDER — PHENYLEPHRINE 80 MCG/ML (10ML) SYRINGE FOR IV PUSH (FOR BLOOD PRESSURE SUPPORT)
PREFILLED_SYRINGE | INTRAVENOUS | Status: AC
Start: 2022-06-22 — End: ?
  Filled 2022-06-22: qty 10

## 2022-06-22 MED ORDER — FENTANYL CITRATE (PF) 250 MCG/5ML IJ SOLN
INTRAMUSCULAR | Status: AC
  Filled 2022-06-22: qty 5

## 2022-06-22 MED ORDER — METOPROLOL TARTRATE 5 MG/5ML IV SOLN: 5.0000 mg | Freq: Four times a day (QID) | INTRAVENOUS | Status: DC | PRN

## 2022-06-22 MED ORDER — ONDANSETRON HCL 4 MG/2ML IJ SOLN
INTRAMUSCULAR | Status: AC
  Filled 2022-06-22: qty 2

## 2022-06-22 MED ORDER — OXYCODONE HCL 5 MG/5ML PO SOLN: 5.0000 mg | Freq: Four times a day (QID) | ORAL | Status: DC | PRN

## 2022-06-22 MED ORDER — SCOPOLAMINE 1 MG/3DAYS TD PT72
1.0000 | MEDICATED_PATCH | TRANSDERMAL | Status: DC
  Administered 2022-06-22: 1.5 mg via TRANSDERMAL
  Filled 2022-06-22: qty 1

## 2022-06-22 MED ORDER — KETAMINE HCL 10 MG/ML IJ SOLN
INTRAMUSCULAR | Status: DC | PRN
  Administered 2022-06-22: 30 mg via INTRAVENOUS
  Administered 2022-06-22: 20 mg via INTRAVENOUS

## 2022-06-22 MED ORDER — ONDANSETRON HCL 4 MG/2ML IJ SOLN: 4.0000 mg | Freq: Once | INTRAMUSCULAR | Status: DC | PRN

## 2022-06-22 MED ORDER — SUGAMMADEX SODIUM 200 MG/2ML IV SOLN
INTRAVENOUS | Status: DC | PRN
  Administered 2022-06-22: 400 mg via INTRAVENOUS

## 2022-06-22 MED ORDER — BUPIVACAINE-EPINEPHRINE (PF) 0.25% -1:200000 IJ SOLN
INTRAMUSCULAR | Status: AC
  Filled 2022-06-22: qty 30

## 2022-06-22 MED ORDER — ALPRAZOLAM 0.5 MG PO TABS: 0.5000 mg | ORAL_TABLET | Freq: Two times a day (BID) | ORAL | Status: DC | PRN

## 2022-06-22 MED ORDER — PROPOFOL 10 MG/ML IV BOLUS
INTRAVENOUS | Status: AC
Start: 2022-06-22 — End: ?
  Filled 2022-06-22: qty 20

## 2022-06-22 MED ORDER — LACTATED RINGERS IV SOLN: INTRAVENOUS | Status: DC

## 2022-06-22 MED ORDER — 0.9 % SODIUM CHLORIDE (POUR BTL) OPTIME
TOPICAL | Status: DC | PRN
  Administered 2022-06-22: 1000 mL

## 2022-06-22 MED ORDER — STERILE WATER FOR IRRIGATION IR SOLN
Status: DC | PRN
  Administered 2022-06-22: 500 mL
  Administered 2022-06-22: 1000 mL

## 2022-06-22 MED ORDER — OXYCODONE HCL 5 MG/5ML PO SOLN: 5.0000 mg | Freq: Once | ORAL | Status: DC | PRN

## 2022-06-22 MED ORDER — ACETAMINOPHEN 500 MG PO TABS
1000.0000 mg | ORAL_TABLET | Freq: Three times a day (TID) | ORAL | Status: DC
Start: 2022-06-22 — End: 2022-06-23
  Administered 2022-06-22 – 2022-06-23 (×3): 1000 mg via ORAL
  Filled 2022-06-22 (×3): qty 2

## 2022-06-22 MED ORDER — FENTANYL CITRATE PF 50 MCG/ML IJ SOSY
25.0000 ug | PREFILLED_SYRINGE | INTRAMUSCULAR | Status: DC | PRN
  Administered 2022-06-22: 50 ug via INTRAVENOUS

## 2022-06-22 MED ORDER — ENOXAPARIN SODIUM 30 MG/0.3ML IJ SOSY
30.0000 mg | PREFILLED_SYRINGE | Freq: Two times a day (BID) | INTRAMUSCULAR | Status: DC
Start: 2022-06-22 — End: 2022-06-23
  Administered 2022-06-22 – 2022-06-23 (×2): 30 mg via SUBCUTANEOUS
  Filled 2022-06-22 (×2): qty 0.3

## 2022-06-22 MED ORDER — KETAMINE HCL 50 MG/5ML IJ SOSY
PREFILLED_SYRINGE | INTRAMUSCULAR | Status: AC
  Filled 2022-06-22: qty 5

## 2022-06-22 MED ORDER — METHOCARBAMOL 500 MG IVPB - SIMPLE MED: 500.0000 mg | Freq: Four times a day (QID) | INTRAVENOUS | Status: DC | PRN

## 2022-06-22 MED ORDER — AMISULPRIDE (ANTIEMETIC) 5 MG/2ML IV SOLN
10.0000 mg | Freq: Once | INTRAVENOUS | Status: DC | PRN
Start: 2022-06-22 — End: 2022-06-22

## 2022-06-22 MED ORDER — ONDANSETRON HCL 4 MG/2ML IJ SOLN
INTRAMUSCULAR | Status: DC | PRN
  Administered 2022-06-22: 4 mg via INTRAVENOUS

## 2022-06-22 MED ORDER — SUGAMMADEX SODIUM 500 MG/5ML IV SOLN
INTRAVENOUS | Status: AC
  Filled 2022-06-22: qty 5

## 2022-06-22 MED ORDER — GABAPENTIN 100 MG PO CAPS
200.0000 mg | ORAL_CAPSULE | Freq: Two times a day (BID) | ORAL | Status: DC
Start: 2022-06-22 — End: 2022-06-23
  Administered 2022-06-22 – 2022-06-23 (×2): 200 mg via ORAL
  Filled 2022-06-22 (×2): qty 2

## 2022-06-22 SURGICAL SUPPLY — 73 items
APPLIER CLIP ROT 10 11.4 M/L (STAPLE) ×2
APPLIER CLIP ROT 13.4 12 LRG (CLIP)
BAG COUNTER SPONGE SURGICOUNT (BAG) IMPLANT
BAG LAPAROSCOPIC 12 15 PORT 16 (BASKET) IMPLANT
BAG RETRIEVAL 12/15 (BASKET)
BENZOIN TINCTURE PRP APPL 2/3 (GAUZE/BANDAGES/DRESSINGS) ×2 IMPLANT
BLADE SURG SZ11 CARB STEEL (BLADE) ×2 IMPLANT
BNDG ADH 1X3 SHEER STRL LF (GAUZE/BANDAGES/DRESSINGS) ×12 IMPLANT
BNDG ELASTIC 6X15 VLCR STRL LF (GAUZE/BANDAGES/DRESSINGS) ×1 IMPLANT
CABLE HIGH FREQUENCY MONO STRZ (ELECTRODE) IMPLANT
CHLORAPREP W/TINT 26 (MISCELLANEOUS) ×4 IMPLANT
CLIP APPLIE ROT 10 11.4 M/L (STAPLE) IMPLANT
CLIP APPLIE ROT 13.4 12 LRG (CLIP) IMPLANT
COVER SURGICAL LIGHT HANDLE (MISCELLANEOUS) ×2 IMPLANT
DEVICE SUT QUICK LOAD TK 5 (SUTURE) IMPLANT
DEVICE SUT TI-KNOT TK 5X26 (SUTURE) IMPLANT
DRAPE UTILITY XL STRL (DRAPES) ×4 IMPLANT
ELECT REM PT RETURN 15FT ADLT (MISCELLANEOUS) ×2 IMPLANT
GAUZE SPONGE 4X4 12PLY STRL (GAUZE/BANDAGES/DRESSINGS) IMPLANT
GLOVE BIO SURGEON STRL SZ 6 (GLOVE) ×2 IMPLANT
GLOVE INDICATOR 6.5 STRL GRN (GLOVE) ×2 IMPLANT
GLOVE SS BIOGEL STRL SZ 6 (GLOVE) ×1 IMPLANT
GLOVE SUPERSENSE BIOGEL SZ 6 (GLOVE) ×1
GOWN STRL REUS W/ TWL LRG LVL3 (GOWN DISPOSABLE) ×1 IMPLANT
GOWN STRL REUS W/ TWL XL LVL3 (GOWN DISPOSABLE) IMPLANT
GOWN STRL REUS W/TWL LRG LVL3 (GOWN DISPOSABLE) ×2
GOWN STRL REUS W/TWL XL LVL3 (GOWN DISPOSABLE)
GRASPER SUT TROCAR 14GX15 (MISCELLANEOUS) ×2 IMPLANT
IRRIG SUCT STRYKERFLOW 2 WTIP (MISCELLANEOUS) ×2
IRRIGATION SUCT STRKRFLW 2 WTP (MISCELLANEOUS) ×1 IMPLANT
KIT BASIN OR (CUSTOM PROCEDURE TRAY) ×2 IMPLANT
KIT TURNOVER KIT A (KITS) IMPLANT
MARKER SKIN DUAL TIP RULER LAB (MISCELLANEOUS) ×2 IMPLANT
MAT PREVALON FULL STRYKER (MISCELLANEOUS) ×2 IMPLANT
NDL SPNL 22GX3.5 QUINCKE BK (NEEDLE) ×1 IMPLANT
NEEDLE SPNL 22GX3.5 QUINCKE BK (NEEDLE) ×2 IMPLANT
PACK UNIVERSAL I (CUSTOM PROCEDURE TRAY) ×2 IMPLANT
RELOAD ENDO STITCH (ENDOMECHANICALS) IMPLANT
RELOAD STAPLE 60 3.6 BLU REG (STAPLE) ×1 IMPLANT
RELOAD STAPLE 60 3.8 GOLD REG (STAPLE) ×1 IMPLANT
RELOAD STAPLE 60 4.1 GRN THCK (STAPLE) IMPLANT
RELOAD STAPLER BLUE 60MM (STAPLE) ×5 IMPLANT
RELOAD STAPLER GOLD 60MM (STAPLE) ×1 IMPLANT
RELOAD STAPLER GREEN 60MM (STAPLE) IMPLANT
RELOAD SUT TRIPLE-STITCH 2-0 (ENDOMECHANICALS) IMPLANT
SCISSORS LAP 5X45 EPIX DISP (ENDOMECHANICALS) ×2 IMPLANT
SET TUBE SMOKE EVAC HIGH FLOW (TUBING) ×2 IMPLANT
SHEARS HARMONIC ACE PLUS 45CM (MISCELLANEOUS) ×2 IMPLANT
SLEEVE ADV FIXATION 5X100MM (TROCAR) ×4 IMPLANT
SLEEVE GASTRECTOMY 40FR VISIGI (MISCELLANEOUS) ×2 IMPLANT
SOL ANTI FOG 6CC (MISCELLANEOUS) ×1 IMPLANT
SOLUTION ANTI FOG 6CC (MISCELLANEOUS) ×1
SPIKE FLUID TRANSFER (MISCELLANEOUS) ×2 IMPLANT
STAPLE LINE REINFORCEMENT LAP (STAPLE) ×5 IMPLANT
STAPLER ECHELON LONG 60 440 (INSTRUMENTS) ×2 IMPLANT
STAPLER RELOAD BLUE 60MM (STAPLE) ×10
STAPLER RELOAD GOLD 60MM (STAPLE) ×2
STAPLER RELOAD GREEN 60MM (STAPLE)
STRIP CLOSURE SKIN 1/2X4 (GAUZE/BANDAGES/DRESSINGS) ×2 IMPLANT
SUT MNCRL AB 4-0 PS2 18 (SUTURE) ×2 IMPLANT
SUT SURGIDAC NAB ES-9 0 48 120 (SUTURE) IMPLANT
SUT VICRYL 0 TIES 12 18 (SUTURE) ×2 IMPLANT
SYR 10ML ECCENTRIC (SYRINGE) ×2 IMPLANT
SYR 20ML LL LF (SYRINGE) ×2 IMPLANT
SYR 50ML LL SCALE MARK (SYRINGE) ×2 IMPLANT
SYS KII OPTICAL ACCESS 15MM (TROCAR) ×2
SYSTEM KII OPTICAL ACCESS 15MM (TROCAR) ×1 IMPLANT
TOWEL OR 17X26 10 PK STRL BLUE (TOWEL DISPOSABLE) ×2 IMPLANT
TOWEL OR NON WOVEN STRL DISP B (DISPOSABLE) ×2 IMPLANT
TROCAR ADV FIXATION 5X100MM (TROCAR) ×2 IMPLANT
TROCAR XCEL NON-BLD 5MMX100MML (ENDOMECHANICALS) ×2 IMPLANT
TUBING CONNECTING 10 (TUBING) ×2 IMPLANT
TUBING ENDO SMARTCAP (MISCELLANEOUS) ×2 IMPLANT

## 2022-06-22 NOTE — Anesthesia Procedure Notes (Signed)
Procedure Name: Intubation Date/Time: 06/22/2022 9:40 AM  Performed by: Maxwell Caul, CRNAPre-anesthesia Checklist: Patient identified, Emergency Drugs available, Suction available and Patient being monitored Patient Re-evaluated:Patient Re-evaluated prior to induction Oxygen Delivery Method: Circle system utilized Preoxygenation: Pre-oxygenation with 100% oxygen Induction Type: IV induction Ventilation: Mask ventilation without difficulty Laryngoscope Size: Mac and 4 Grade View: Grade I Tube type: Oral Tube size: 7.0 mm Number of attempts: 1 Airway Equipment and Method: Stylet and Oral airway Placement Confirmation: ETT inserted through vocal cords under direct vision, positive ETCO2 and breath sounds checked- equal and bilateral Secured at: 22 cm Tube secured with: Tape Dental Injury: Teeth and Oropharynx as per pre-operative assessment

## 2022-06-22 NOTE — Interval H&P Note (Signed)
History and Physical Interval Note:  06/22/2022 8:50 AM  Morgan Romero  has presented today for surgery, with the diagnosis of morbid obesity.  The various methods of treatment have been discussed with the patient and family. After consideration of risks, benefits and other options for treatment, the patient has consented to  Procedure(s): LAPAROSCOPIC SLEEVE GASTRECTOMY (N/A) UPPER GI ENDOSCOPY (N/A) as a surgical intervention.  The patient's history has been reviewed, patient examined, no change in status, stable for surgery.  I have reviewed the patient's chart and labs.  Questions were answered to the patient's satisfaction.     [name] Lollie Sails

## 2022-06-22 NOTE — Progress Notes (Signed)

## 2022-06-22 NOTE — Op Note (Signed)
Morgan Romero [id number] [birth year] 06/22/2022  Preoperative diagnosis: severe obesity  Postoperative diagnosis: Same   Procedure: upper endoscopy   Surgeon: Mary Sella. [name] M.D., FACS   Anesthesia: Gen.   Indications for procedure: 46 y.o. year old female undergoing Laparoscopic Gastric Sleeve Resection and an EGD was requested to evaluate the new gastric sleeve.   Description of procedure: After we have completed the sleeve resection, I scrubbed out and obtained the Olympus endoscope. I gently placed endoscope in the patient's oropharynx and gently glided it down the esophagus without any difficulty under direct visualization. Once I was in the gastric sleeve, I insufflated the stomach with air. I was able to cannulate and advanced the scope through the gastric sleeve. I was able to cannulate the duodenum with ease. Dr. Fredricka Bonine had placed saline in the upper abdomen. Upon further insufflation of the gastric sleeve there was no evidence of bubbles. GE junction located at 39 cm.  Upon further inspection of the gastric sleeve, the mucosa appeared normal. There is no evidence of any mucosal abnormality. The sleeve was widely patent at the angularis. There was no evidence of bleeding. The gastric sleeve was decompressed. The scope was withdrawn. The patient tolerated this portion of the procedure well. Please see Dr Derrill Memo operative note for details regarding the laparoscopic gastric sleeve resection.   Mary Sella. Andrey Campanile, MD, FACS  General, Bariatric, & Minimally Invasive Surgery  [facility], [place]

## 2022-06-22 NOTE — Discharge Instructions (Signed)

## 2022-06-22 NOTE — Anesthesia Preprocedure Evaluation (Signed)
Anesthesia Evaluation  Patient identified by MRN, date of birth, ID band Patient awake    Reviewed: Allergy & Precautions, NPO status , Patient's Chart, lab work & pertinent test results  History of Anesthesia Complications Negative for: history of anesthetic complications  Airway Mallampati: III  TM Distance: >3 FB Neck ROM: Full    Dental  (+) Partial Upper, Dental Advisory Given, Loose,    Pulmonary neg pulmonary ROS, former smoker,    Pulmonary exam normal        Cardiovascular hypertension, Normal cardiovascular exam     Neuro/Psych Anxiety Depression Bipolar Disorder negative neurological ROS     GI/Hepatic Neg liver ROS, GERD  ,  Endo/Other  Morbid obesity  Renal/GU negative Renal ROS  negative genitourinary   Musculoskeletal negative musculoskeletal ROS (+)   Abdominal   Peds  Hematology negative hematology ROS (+)   Anesthesia Other Findings   Reproductive/Obstetrics                            Anesthesia Physical Anesthesia Plan  ASA: 3  Anesthesia Plan: General   Post-op Pain Management: Tylenol PO (pre-op)* and Toradol IV (intra-op)*   Induction: Intravenous  PONV Risk Score and Plan: 4 or greater and Ondansetron, Dexamethasone, Treatment may vary due to age or medical condition, Midazolam and Scopolamine patch - Pre-op  Airway Management Planned: Oral ETT  Additional Equipment: None  Intra-op Plan:   Post-operative Plan: Extubation in OR  Informed Consent: I have reviewed the patients History and Physical, chart, labs and discussed the procedure including the risks, benefits and alternatives for the proposed anesthesia with the patient or authorized representative who has indicated his/her understanding and acceptance.     Dental advisory given  Plan Discussed with:   Anesthesia Plan Comments:       Anesthesia Quick Evaluation

## 2022-06-22 NOTE — Progress Notes (Addendum)
PHARMACY CONSULT FOR:  Risk Assessment for Post-Discharge VTE Following Bariatric Surgery  Post-Discharge VTE Risk Assessment: This patient's probability of 30-day post-discharge VTE is increased due to the factors marked: x Sleeve gastrectomy   Liver disorder (transplant, cirrhosis, or nonalcoholic steatohepatitis)   Hx of VTE   Hemorrhage requiring transfusion   GI perforation, leak, or obstruction   ====================================================    Female    Age >/=60 years  x  BMI >/=50 kg/m2    CHF    Dyspnea at Rest    Paraplegia   x Non-gastric-band surgery    Operation Time >/=3 hr    Return to OR     Length of Stay >/= 3 d   Hypercoagulable condition   Significant venous stasis      Predicted probability of 30-day post-discharge VTE: 0.27%  Other patient-specific factors to consider: N/A   Recommendation for Discharge: No pharmacologic prophylaxis post-discharge    Morgan Romero is a 46 y.o. female who underwent  laparoscopic sleeve gastrectomy on 06/22/2022.    Case start: 0956 Case end: 1053   Allergies  Allergen Reactions   Strawberry (Diagnostic) Hives    Patient Measurements: Height: 5\' 4"  (162.6 cm) Weight: (!) 148.1 kg (326 lb 9.6 oz) IBW/kg (Calculated) : 54.7 Body mass index is 56.06 kg/m.  No results for input(s): "WBC", "HGB", "HCT", "PLT", "APTT", "CREATININE", "LABCREA", "CREAT24HRUR", "MG", "PHOS", "ALBUMIN", "PROT", "AST", "ALT", "ALKPHOS", "BILITOT", "BILIDIR", "IBILI" in the last 72 hours. Estimated Creatinine Clearance: 91.4 mL/min (A) (by C-G formula based on SCr of 1.13 mg/dL (H)).    Past Medical History:  Diagnosis Date   ADHD (attention deficit hyperactivity disorder)    Anemia    when younger   Anxiety    Back pain    Bipolar disorder (HCC)    Depression    GERD (gastroesophageal reflux disease)    Headache(784.0)    Hypertension    Obsessive-compulsive disorder    Seasonal allergies    Vitamin D  deficiency      Medications Prior to Admission  Medication Sig Dispense Refill Last Dose   acetaminophen (TYLENOL) 500 MG tablet Take 1,000 mg by mouth every 6 (six) hours as needed for moderate pain or mild pain.   06/22/2022 at 0600   ALPRAZolam (XANAX) 0.5 MG tablet Take 1 tablet (0.5 mg total) by mouth 2 (two) times daily as needed for anxiety. 60 tablet 5 06/22/2022 at 0600   buPROPion (WELLBUTRIN XL) 150 MG 24 hr tablet Take 3 tablets (450 mg total) by mouth daily. 270 tablet 1 06/22/2022 at 0600   calcium carbonate (TUMS EX) 750 MG chewable tablet Chew 1 tablet by mouth daily as needed for heartburn.   06/21/2022   Cholecalciferol 125 MCG (5000 UT) TABS Take 5,000 Units by mouth daily.   06/21/2022   cyanocobalamin 100 MCG tablet Take 100 mcg by mouth daily.   06/21/2022   fexofenadine (ALLEGRA) 180 MG tablet Take 180 mg by mouth daily.   06/22/2022 at 0600   lisinopril-hydrochlorothiazide (ZESTORETIC) 20-12.5 MG tablet TAKE 1 TABLET BY MOUTH DAILY 30 tablet 2 06/21/2022   potassium chloride (KLOR-CON M) 10 MEQ tablet Take 1 tablet (10 mEq total) by mouth daily. 90 tablet 1 06/21/2022       06/23/2022 M 06/22/2022,12:50 PM

## 2022-06-22 NOTE — Anesthesia Postprocedure Evaluation (Signed)
Anesthesia Post Note  Patient: Natsha L Nulty  Procedure(s) Performed: LAPAROSCOPIC SLEEVE GASTRECTOMY UPPER GI ENDOSCOPY     Patient location during evaluation: PACU Anesthesia Type: General Level of consciousness: awake and alert Pain management: pain level controlled Vital Signs Assessment: post-procedure vital signs reviewed and stable Respiratory status: spontaneous breathing, nonlabored ventilation and respiratory function stable Cardiovascular status: blood pressure returned to baseline and stable Postop Assessment: no apparent nausea or vomiting Anesthetic complications: no   No notable events documented.  Last Vitals:  Vitals:   06/22/22 1215 06/22/22 1232  BP: (!) 162/80 (!) 153/89  Pulse: 66 63  Resp: 16 18  Temp: 36.6 C 36.6 C  SpO2: 98% 98%    Last Pain:  Vitals:   06/22/22 1232  TempSrc: Oral  PainSc:                  Lucretia Kern

## 2022-06-22 NOTE — Op Note (Signed)
Operative Note  Morgan Romero  [id number]  [id number]  06/22/2022   Surgeon: Phylliss Blakes MD FACS   Assistant: Gaynelle Adu MD FACS   Procedure performed: laparoscopic sleeve gastrectomy, upper endoscopy   Preop diagnosis: Morbid obesity Body mass index is 56.06 kg/m. Post-op diagnosis/intraop findings: same   Specimens: fundus Retained items: none  EBL: minimal  Complications: none   Description of procedure: After obtaining informed consent and administration of chemical DVT prophylaxis in holding, the patient was taken to the operating room and placed supine on operating room table where general endotracheal anesthesia was initiated, preoperative antibiotics were administered, SCDs applied, and a formal timeout was performed. The abdomen was prepped and draped in usual sterile fashion. Peritoneal access was gained using a Visiport technique in the left upper quadrant and insufflation to 15 mmHg ensued without issue. Gross inspection revealed no evidence of injury.  Under direct visualization three more 5 mm trochars were placed in the right and left hemiabdomen and the 40mm trocar in the right paramedian upper abdomen. Bilateral laparoscopic assisted TAPS blocks were performed with Exparel diluted with 0.25 percent Marcaine with epinephrine. The patient was placed in steep reverseTrendelenburg and the liver retractor was introduced through an incision in the upper midline and secured to the post externally to maintain the left lobe retracted anteriorly.  There is no hiatal hernia on direct inspection.  Using the Harmonic scalpel, the greater curvature of the stomach was dissected away from the greater omentum and short gastric vessels were divided. This began 6 cm from the pylorus, and dissection proceeded until the left crus was clearly exposed. There were some filmy adhesions of the posterior stomach to the retroperitoneum which were divided with the Harmonic. Esophageal fat pad  was mobilized off the anterior stomach slightly. The 60 [place] VisiGi was then introduced and directed down towards the pylorus. This was placed to suction against the lesser curve. Serial fires of the linear cutting stapler were then employed to create our sleeve. The first fire used a gold load and ensured adequate room at the angularis incisura. Several blue loads were then employed to create a narrow tubular stomach preserving 1cm lateral to the angle of His. The excised stomach was then removed through our 15 mm trocar site within an Endo Catch bag.  The visigi was taken off of suction and a few puffs of air were introduced, inflating the sleeve. No bubbles were observed in the irrigation fluid around the stomach and the shape was noted to be evenly tubular without any narrowing at the angularis. The visigi was then removed. Upper endoscopy was performed by the assistant surgeon and the sleeve was noted to be airtight, the staple line was hemostatic. Please see his separate note. The endoscope was removed. A small amount of oozing on the staple line was addressed with clips. The 15 mm trocar site fascia in the right upper abdomen was closed with a 0 Vicryl using the laparoscopic suture passer under direct visualization. The liver retractor was removed under direct visualization. The abdomen was then desufflated and all remaining trochars removed. The skin incisions were closed with subcuticular 4-0 Monocryl; benzoin, Steri-Strips and Band-Aids were applied The patient was then awakened, extubated and taken to PACU in stable condition.     All counts were correct at the completion of the case.

## 2022-06-22 NOTE — Transfer of Care (Signed)
Immediate Anesthesia Transfer of Care Note  Patient: Morgan Romero  Procedure(s) Performed: LAPAROSCOPIC SLEEVE GASTRECTOMY UPPER GI ENDOSCOPY  Patient Location: PACU  Anesthesia Type:General  Level of Consciousness: awake, alert  and oriented  Airway & Oxygen Therapy: Patient Spontanous Breathing and Patient connected to face mask oxygen  Post-op Assessment: Report given to RN and Post -op Vital signs reviewed and stable  Post vital signs: Reviewed and stable  Last Vitals:  Vitals Value Taken Time  BP    Temp    Pulse    Resp    SpO2      Last Pain:  Vitals:   06/22/22 0813  TempSrc:   PainSc: 0-No pain         Complications: No notable events documented.

## 2022-06-23 ENCOUNTER — Encounter (HOSPITAL_COMMUNITY): Payer: Self-pay | Admitting: Surgery

## 2022-06-23 ENCOUNTER — Other Ambulatory Visit (HOSPITAL_COMMUNITY): Payer: Self-pay

## 2022-06-23 LAB — COMPREHENSIVE METABOLIC PANEL
ALT: 26 U/L (ref 0–44)
AST: 21 U/L (ref 15–41)
Albumin: 3.4 g/dL — ABNORMAL LOW (ref 3.5–5.0)
Alkaline Phosphatase: 41 U/L (ref 38–126)
Anion gap: 7 (ref 5–15)
BUN: 12 mg/dL (ref 6–20)
CO2: 21 mmol/L — ABNORMAL LOW (ref 22–32)
Calcium: 8.4 mg/dL — ABNORMAL LOW (ref 8.9–10.3)
Chloride: 113 mmol/L — ABNORMAL HIGH (ref 98–111)
Creatinine, Ser: 0.83 mg/dL (ref 0.44–1.00)
GFR, Estimated: >60 mL/min (ref >60)
Glucose, Bld: 112 mg/dL — ABNORMAL HIGH (ref 70–99)
Potassium: 3.9 mmol/L (ref 3.5–5.1)
Sodium: 141 mmol/L (ref 135–145)
Total Bilirubin: 0.4 mg/dL (ref 0.3–1.2)
Total Protein: 6.6 g/dL (ref 6.5–8.1)

## 2022-06-23 LAB — CBC
HCT: 36.6 % (ref 36.0–46.0)
Hemoglobin: 12 g/dL (ref 12.0–15.0)
MCH: 28.5 pg (ref 26.0–34.0)
MCHC: 32.8 g/dL (ref 30.0–36.0)
MCV: 86.9 fL (ref 80.0–100.0)
Platelets: 188 10*3/uL (ref 150–400)
RBC: 4.21 MIL/uL (ref 3.87–5.11)
RDW: 13.9 % (ref 11.5–15.5)
WBC: 8.1 10*3/uL (ref 4.0–10.5)
nRBC: 0 % (ref 0.0–0.2)

## 2022-06-23 LAB — MAGNESIUM: Magnesium: 2.4 mg/dL (ref 1.7–2.4)

## 2022-06-23 LAB — SURGICAL PATHOLOGY

## 2022-06-23 MED ORDER — DOCUSATE SODIUM 100 MG PO CAPS
100.0000 mg | ORAL_CAPSULE | Freq: Two times a day (BID) | ORAL | 0 refills | Status: DC
  Filled 2022-06-23 – 2022-07-22 (×2): qty 30, 15d supply, fill #0

## 2022-06-23 MED ORDER — GABAPENTIN 100 MG PO CAPS
200.0000 mg | ORAL_CAPSULE | Freq: Two times a day (BID) | ORAL | 0 refills | Status: DC
  Filled 2022-06-23: qty 20, 5d supply, fill #0

## 2022-06-23 MED ORDER — PANTOPRAZOLE SODIUM 40 MG PO TBEC
40.0000 mg | DELAYED_RELEASE_TABLET | Freq: Every day | ORAL | 0 refills | Status: DC
  Filled 2022-06-23: qty 30, 30d supply, fill #0
  Filled 2022-07-22: qty 30, 30d supply, fill #1

## 2022-06-23 MED ORDER — TRAMADOL HCL 50 MG PO TABS
50.0000 mg | ORAL_TABLET | Freq: Four times a day (QID) | ORAL | 0 refills | Status: DC | PRN
  Filled 2022-06-23: qty 10, 3d supply, fill #0

## 2022-06-23 MED ORDER — ONDANSETRON 4 MG PO TBDP
4.0000 mg | ORAL_TABLET | Freq: Four times a day (QID) | ORAL | 0 refills | Status: DC | PRN
  Filled 2022-06-23: qty 20, 5d supply, fill #0

## 2022-06-23 NOTE — Progress Notes (Addendum)
Patient alert and oriented, pain is controlled. Patient is tolerating fluids, advanced to protein shake today, patient is tolerating well.  Reviewed Gastric sleeve discharge instructions with patient and patient is able to articulate understanding.  Provided information on BELT program, Support Group and [facility] outpatient pharmacy. All questions answered, will continue to monitor.   24hr fluid recall prior to discharge: ( + ). Per dehydration protocol, will call pt to f/u within one week post op.

## 2022-06-23 NOTE — Discharge Summary (Signed)
Physician Discharge Summary  Morgan Romero GUR:[id number] DOB: [birth year] DOA: 06/22/2022  PCP: [name], NP  Admit date: 06/22/2022 Discharge date: 06/23/2022   Recommendations for Outpatient Follow-up:    Follow-up Information     [name], MD. Go on 07/16/2022.   Specialty: General Surgery Why: at 10:20am. Please arrive 15 minutes prior to your appointment time.  Thank you. Contact information: [address] [place] [address] [phone]         [name], MD. Go on 08/12/2022.   Specialty: General Surgery Why: at 4:10pm.  Please arrive 15 minutes prior to your appointment time.  Thank you. Contact information: [address] [place] [address] [phone]                Discharge Diagnoses:  Principal Problem:   Morbid obesity (Morgan Romero)   Surgical Procedure: Laparoscopic Sleeve Gastrectomy, upper endoscopy  Discharge Condition: Good Disposition: Home  Diet recommendation: Postoperative sleeve gastrectomy diet (liquids only)  Filed Weights   06/22/22 0757 06/22/22 0813  Weight: (!) 148.1 kg (!) 148.1 kg     Hospital Course:  The patient was admitted for a planned laparoscopic sleeve gastrectomy. Please see operative note. Preoperatively the patient was given 5000 units of subcutaneous heparin for DVT prophylaxis. Postoperative prophylactic Lovenox dosing was started on the evening of postoperative day 0. ERAS protocol was used. On the evening of postoperative day 0, the patient was started on water and ice chips. On postoperative day 1 the patient had no fever or tachycardia and was tolerating water in their diet was gradually advanced throughout the day. The patient was ambulating without difficulty. Their vital signs are stable without fever or tachycardia. Their hemoglobin had remained stable. The patient had received discharge instructions and counseling. They were deemed stable for  discharge and had met discharge criteria  Vitals:   06/23/22 0131 06/23/22 0526  BP: 138/70 (!) 164/78  Pulse: 60 (!) 56  Resp: 16 18  Temp: 98.2 F (36.8 C) 98.1 F (36.7 C)  SpO2: 96% 97%    Alert and well-appearing Unlabored respirations Abdomen is soft, nontender, incisions clean dry intact with Steri-Strips and Band-Aids.  No cellulitis, hematoma, significant ecchymosis or hernia.   Discharge Instructions   Allergies as of 06/23/2022       Reactions   Strawberry (diagnostic) Hives        Medication List     TAKE these medications    acetaminophen 500 MG tablet Commonly known as: TYLENOL Take 1,000 mg by mouth every 6 (six) hours as needed for moderate pain or mild pain.   ALPRAZolam 0.5 MG tablet Commonly known as: XANAX Take 1 tablet (0.5 mg total) by mouth 2 (two) times daily as needed for anxiety.   buPROPion 150 MG 24 hr tablet Commonly known as: WELLBUTRIN XL Take 3 tablets (450 mg total) by mouth daily.   calcium carbonate 750 MG chewable tablet Commonly known as: TUMS EX Chew 1 tablet by mouth daily as needed for heartburn.   Cholecalciferol 125 MCG (5000 UT) Tabs Take 5,000 Units by mouth daily.   cyanocobalamin 100 MCG tablet Take 100 mcg by mouth daily.   docusate sodium 100 MG capsule Commonly known as: Colace Take 1 capsule (100 mg total) by mouth 2 (two) times daily. Okay to decrease to once daily or stop taking if having loose bowel movements   fexofenadine 180 MG tablet Commonly known as: ALLEGRA Take 180  mg by mouth daily.   gabapentin 100 MG capsule Commonly known as: NEURONTIN Take 2 capsules (200 mg total) by mouth every 12 (twelve) hours.   lisinopril-hydrochlorothiazide 20-12.5 MG tablet Commonly known as: ZESTORETIC TAKE 1 TABLET BY MOUTH DAILY Notes to patient: Monitor Blood Pressure Daily and keep a log for primary care physician.  You may need to make changes to your medications with rapid weight loss.      ondansetron 4 MG disintegrating tablet Commonly known as: ZOFRAN-ODT Take 1 tablet (4 mg total) by mouth every 6 (six) hours as needed for nausea or vomiting.   pantoprazole 40 MG tablet Commonly known as: PROTONIX Take 1 tablet (40 mg total) by mouth daily. Take this medication daily regardless of reflux symptoms   potassium chloride 10 MEQ tablet Commonly known as: KLOR-CON M Take 1 tablet (10 mEq total) by mouth daily.   traMADol 50 MG tablet Commonly known as: ULTRAM Take 1 tablet (50 mg total) by mouth every 6 (six) hours as needed (pain).        Follow-up Information     [name], MD. Go on 07/16/2022.   Specialty: General Surgery Why: at 10:20am. Please arrive 15 minutes prior to your appointment time.  Thank you. Contact information: [address] [place] [address] [phone]         [name], MD. Go on 08/12/2022.   Specialty: General Surgery Why: at 4:10pm.  Please arrive 15 minutes prior to your appointment time.  Thank you. Contact information: [address] [place] [address] [phone]                  The results of significant diagnostics from this hospitalization (including imaging, microbiology, ancillary and laboratory) are listed below for reference.    Significant Diagnostic Studies: No results found.  Labs: Basic Metabolic Panel: Recent Labs  Lab 06/23/22 0424  NA 141  K 3.9  CL 113*  CO2 21*  GLUCOSE 112*  BUN 12  CREATININE 0.83  CALCIUM 8.4*  MG 2.4   Liver Function Tests: Recent Labs  Lab 06/23/22 0424  AST 21  ALT 26  ALKPHOS 41  BILITOT 0.4  PROT 6.6  ALBUMIN 3.4*    CBC: Recent Labs  Lab 06/23/22 0424  WBC 8.1  HGB 12.0  HCT 36.6  MCV 86.9  PLT 188    CBG: No results for input(s): "GLUCAP" in the last 168 hours.  Principal Problem:   Morbid obesity (Morgan Romero)    Signed:  [facility],  [place] [phone] 06/23/2022, 7:55 AM

## 2022-06-23 NOTE — Progress Notes (Signed)
  Transition of Care (TOC) Screening Note   Patient Details  Name: Morgan Romero Date of Birth: 1975/12/01   Transition of Care (TOC) CM/SW Contact:    Amada Jupiter, LCSW Phone Number: 06/23/2022, 10:01 AM    Transition of Care Department [facility]) has reviewed patient and no TOC needs have been identified at this time. We will continue to monitor patient advancement through interdisciplinary progression rounds. If new patient transition needs arise, please place a TOC consult.

## 2022-06-23 NOTE — Progress Notes (Signed)
Discharge instructions given to patient and all questions were answered.  

## 2022-06-25 ENCOUNTER — Telehealth (HOSPITAL_COMMUNITY): Payer: Self-pay | Admitting: *Deleted

## 2022-06-25 NOTE — Telephone Encounter (Signed)
.    Tell me about your pain and pain management? Pt denies any pain.  2.  Let's talk about fluid intake.  How much total fluid are you taking in? Pt states that she is working to meet goal of 64 oz of fluid today.  Pt has been able to consume approx. 50-55oz of fluid per day since surgery.  Pt plans to increase clear liquids to meet fluid goals including water, beef broth and jello.  Pt encouraged to continue to work towards meeting goal.  Pt instructed to assess status and suggestions daily utilizing Hydration Action Plan on discharge folder and to call [facility] if in the "red zone".   3.  How much protein have you taken in the last 2 days? Pt states that she is working to meet goal of goal of 60g of protein today.  Pt has already consumed one protein shake.  Pt plans to drink remainder of protein throughout the rest of the day to meet goal.  4.  Have you had nausea?  Tell me about when have experienced nausea and what you did to help? Pt denies nausea.   5.  Has the frequency or color changed with your urine? Pt states that she is urinating "fine" with no changes in frequency or urgency.     6.  Tell me what your incisions look like? Pt will shower today, but has not removed bandaids.  Instructed pt to remove outer bandages (removed during our call) and leave incisions open to air and monitor for any abnormal symptoms.   7.  Have you been passing gas? BM? Pt states that she is having BMs. Last BM 06/25/22.     8.  If a problem or question were to arise who would you call?  Do you know contact numbers for [facility], [facility], and [facility]? Pt denies dehydration symptoms.  Pt can describe s/sx of dehydration.  Pt knows to call [facility] for surgical, [facility] for nutrition, and [facility] for non-urgent questions or concerns.   9.  How has the walking going? Pt states she is walking around and able to be active without difficulty.   10. Are you still using your incentive spirometer?  If so, how often? Pt states that she is  doing the I.S. "some".   Pt encouraged to use incentive spirometer, at least 10x every hour while awake until she sees the surgeon.  11.  How are your vitamins and calcium going?  How are you taking them? Pt states that she is taking her supplements and vitamins without difficulty.  Reminded patient that the first 30 days post-operatively are important for successful recovery.  Practice good hand hygiene, wearing a mask when appropriate (since optional in most places), and minimizing exposure to people who live outside of the home, especially if they are exhibiting any respiratory, GI, or illness-like symptoms.

## 2022-06-29 ENCOUNTER — Other Ambulatory Visit: Payer: Self-pay | Admitting: Nurse Practitioner

## 2022-06-29 DIAGNOSIS — I10 Essential (primary) hypertension: Secondary | ICD-10-CM

## 2022-07-07 ENCOUNTER — Encounter: Payer: No Typology Code available for payment source | Attending: Surgery | Admitting: Dietician

## 2022-07-07 ENCOUNTER — Encounter: Payer: Self-pay | Admitting: Dietician

## 2022-07-07 NOTE — Progress Notes (Signed)
2 Week Post-Operative Nutrition Class   Patient was seen on 07/07/2022 for Post-Operative Nutrition education at the [facility].    Surgery date: 06/22/2022 Surgery type: sleeve  Anthropometrics  Start weight at [facility]: 337 lbs (date: 04/17/2022)  Height: 64 in Weight today: 311.6 lbs. BMI: 53.49 kg/m2     Clinical  Medical hx: HTN, Major depressive disorder, GAD, ADHD Medications: see list  Labs: LDL 112, vitamin D 24.6 Notable signs/symptoms: none identified  Any previous deficiencies? No   Body Composition Scale 07/07/2022  Current Body Weight 311.6  Total Body Fat % 50.0  Visceral Fat 21  Fat-Free Mass % 49.9   Total Body Water % 39.4  Muscle-Mass lbs 32.3  BMI 53.4  Body Fat Displacement          Torso  lbs 96.8         Left Leg  lbs 19.3         Right Leg  lbs 19.3         Left Arm  lbs          Right Arm   lbs       The following the learning objectives were met by the patient during this course: Identifies Phase 3 (Soft, High Proteins) Dietary Goals and will begin from 2 weeks post-operatively to 2 months post-operatively Identifies appropriate sources of fluids and proteins  Identifies appropriate fat sources and healthy verses unhealthy fat types   States protein recommendations and appropriate sources post-operatively Identifies the need for appropriate texture modifications, mastication, and bite sizes when consuming solids Identifies appropriate fat consumption and sources Identifies appropriate multivitamin and calcium sources post-operatively Describes the need for physical activity post-operatively and will follow MD recommendations States when to call healthcare provider regarding medication questions or post-operative complications   Handouts given during class include: Phase 3A: Soft, High Protein Diet Handout Phase 3 High Protein Meals Healthy Fats   Follow-Up Plan: Patient will follow-up at [facility] in 6 weeks for 2  month post-op nutrition visit for diet advancement per MD.

## 2022-07-13 ENCOUNTER — Encounter: Payer: Self-pay | Admitting: Advanced Practice Midwife

## 2022-07-13 ENCOUNTER — Ambulatory Visit (INDEPENDENT_AMBULATORY_CARE_PROVIDER_SITE_OTHER): Payer: No Typology Code available for payment source | Admitting: Advanced Practice Midwife

## 2022-07-13 ENCOUNTER — Telehealth: Payer: Self-pay | Admitting: Dietician

## 2022-07-13 ENCOUNTER — Other Ambulatory Visit (HOSPITAL_COMMUNITY)
Admission: RE | Admit: 2022-07-13 | Discharge: 2022-07-13 | Disposition: A | Discharge status: Home / Self Care | Payer: No Typology Code available for payment source | Source: Ambulatory Visit | Attending: Medical | Admitting: Medical

## 2022-07-13 ENCOUNTER — Encounter: Payer: Self-pay | Admitting: Dietician

## 2022-07-13 VITALS — BP 168/92 | HR 63 | Ht 64.0 in | Wt 311.4 lb

## 2022-07-13 DIAGNOSIS — Z124 Encounter for screening for malignant neoplasm of cervix: Secondary | ICD-10-CM | POA: Insufficient documentation

## 2022-07-13 DIAGNOSIS — N898 Other specified noninflammatory disorders of vagina: Secondary | ICD-10-CM

## 2022-07-13 DIAGNOSIS — Z01419 Encounter for gynecological examination (general) (routine) without abnormal findings: Secondary | ICD-10-CM

## 2022-07-13 DIAGNOSIS — I10 Essential (primary) hypertension: Secondary | ICD-10-CM

## 2022-07-13 DIAGNOSIS — Z30430 Encounter for insertion of intrauterine contraceptive device: Secondary | ICD-10-CM

## 2022-07-13 DIAGNOSIS — Z903 Acquired absence of stomach [part of]: Secondary | ICD-10-CM

## 2022-07-13 DIAGNOSIS — N939 Abnormal uterine and vaginal bleeding, unspecified: Secondary | ICD-10-CM

## 2022-07-13 DIAGNOSIS — Z6841 Body Mass Index (BMI) 40.0 and over, adult: Secondary | ICD-10-CM

## 2022-07-13 MED ORDER — LEVONORGESTREL 20 MCG/DAY IU IUD
1.0000 | INTRAUTERINE_SYSTEM | Freq: Once | INTRAUTERINE | Status: AC
  Administered 2022-07-13: 1 via INTRAUTERINE

## 2022-07-13 NOTE — Telephone Encounter (Signed)
RD called pt to verify fluid intake once starting soft, solid proteins 2 week post-bariatric surgery.   Daily Fluid intake:  Daily Protein intake:  Bowel Habits:   Concerns/issues:    Left Voice Message 

## 2022-07-13 NOTE — Progress Notes (Signed)
New Patient is in the office to establish care Last pap possibly over 3 years ago. Irregular cycles with spotting that can last 3 weeks.  Reports some vaginal dryness

## 2022-07-13 NOTE — Progress Notes (Signed)
Subjective:     Morgan Romero is a 46 y.o. female here at [facility] for a routine exam.  Current complaints: regular frequent bleeding, spotting 10 days to 14 days at a time.  Personal health questionnaire reviewed: yes.  Do you have a primary care provider? yes Do you feel safe at home? yes  Flowsheet Row Office Visit from 07/13/2022 in [facility]  PHQ-2 Total Score 0       Health Maintenance Due  Topic Date Due   COVID-19 Vaccine (1) Never done   HIV Screening  Never done   Hepatitis C Screening  Never done   TETANUS/TDAP  Never done   PAP SMEAR-Modifier  Never done   COLONOSCOPY (Pts 45-30yrs Insurance coverage will need to be confirmed)  Never done   INFLUENZA VACCINE  06/30/2022     Risk factors for chronic health problems: Smoking: Alchohol/how much: Pt BMI: Body mass index is 53.45 kg/m.   Gynecologic History No LMP recorded. (Menstrual status: Irregular Periods). Contraception: abstinence Last Pap: unsure. Results were: normal Last mammogram: unsure but pt reports one in her late 30s.Marland Kitchen Results were: normal. Pt declines mammogram. Discussed benefits/risks, pt to consider and contact office.   Obstetric History OB History  No obstetric history on file.     The following portions of the patient's history were reviewed and updated as appropriate: allergies, current medications, past family history, past medical history, past social history, past surgical history, and problem list.  Review of Systems A comprehensive review of systems was negative.    Objective:   BP (!) 168/92   Pulse 63   Ht 5\' 4"  (1.626 m)   Wt (!) 311 lb 6.4 oz (141.3 kg)   BMI 53.45 kg/m  VS reviewed, nursing note reviewed,  Constitutional: well developed, well nourished, no distress HEENT: normocephalic CV: normal rate Pulm/chest wall: normal effort Breast Exam:  Deferred with low risks and shared decision making, discussed recommendation to  start mammogram between 40-50 yo/ Abdomen: soft Neuro: alert and oriented x 3 Skin: warm, dry Psych: affect normal Pelvic exam:Performed: Cervix pink, visually closed, without lesion, scant white creamy discharge, vaginal walls and external genitalia normal Bimanual exam: Cervix 0/long/high, firm, anterior, neg CMT, uterus nontender, nonenlarged, adnexa without tenderness, enlargement, or mass     IUD Procedure Note Patient identified, informed consent performed.  Discussed risks of irregular bleeding, cramping, infection, malpositioning or misplacement of the IUD outside the uterus which may require further procedures. Time out was performed.  Urine pregnancy test negative.  Speculum placed in the vagina.  Cervix visualized.  Cleaned with Betadine x 2.  Grasped anteriorly with a single tooth tenaculum.  Initially, speculum did not stay in place and slid out of vagina too much to continue insertion.  Tenaculum removed and new speculum inserted.  Plastic cervical dilator used when IUD would not insert through internal os.  Uterus sounded to 7 cm.  Mirena IUD placed per manufacturer's recommendations.  Strings trimmed to 3 cm. Tenaculum was removed, good hemostasis noted.  Patient tolerated procedure well.   Patient was given post-procedure instructions and the Mirena care card with expiration date.  Patient was also asked to check IUD strings periodically and follow up in 4-6 weeks for IUD check.      Assessment/Plan:   1. Screening for cervical cancer  - Cytology - PAP( [place]) - Cervicovaginal ancillary only( [place])  2. Well woman exam with routine gynecological exam --  Pt declines mammogram, I reviewed recommendations per several organizations and pt to consider calling the office later to schedule.    3. Abnormal uterine bleeding (AUB) --Pt with abnormal/irregular menses since menarche ~ age 40.  --Last 2 years with more in between spotting, sometimes bleeding for 10 days  to 2 weeks, usually light spotting. --Discussed options to treat AUB, including OCPs, Depo, IUD, and surgical options. --Pt has expired Nexplanon in left arm  --Pt opts for IUD placement today while performing Pap --Due to time constraints today, consult Dr Alysia Penna, and plan to place IUD today to begin tx for AUB, but remove expired Nexplanon at string check visit  --See procedure note  4. Class 3 severe obesity due to excess calories with serious comorbidity and body mass index (BMI) of 50.0 to 59.9 in adult (HCC)   5. Primary hypertension --Pt has to take medications slowly and over the course of the day due to her gastric sleeve procedure so has not taken her BP meds yet today.  --No chest pain, SOB or other symptoms  6. S/P gastric sleeve procedure    7. Vaginal dryness - Cervicovaginal ancillary only( )   Return in about 4 weeks (around 08/10/2022).   Sharen Counter, CNM 5:38 PM

## 2022-07-15 LAB — CERVICOVAGINAL ANCILLARY ONLY
Bacterial Vaginitis (gardnerella): POSITIVE — AB
Candida Glabrata: NEGATIVE
Candida Vaginitis: NEGATIVE
Comment: NEGATIVE
Comment: NEGATIVE
Comment: NEGATIVE

## 2022-07-16 ENCOUNTER — Ambulatory Visit: Payer: No Typology Code available for payment source | Admitting: Nurse Practitioner

## 2022-07-16 LAB — CYTOLOGY - PAP
Adequacy: ABSENT
Comment: NEGATIVE
Diagnosis: NEGATIVE
High risk HPV: NEGATIVE

## 2022-07-22 ENCOUNTER — Other Ambulatory Visit (HOSPITAL_COMMUNITY): Payer: Self-pay

## 2022-07-24 ENCOUNTER — Ambulatory Visit (INDEPENDENT_AMBULATORY_CARE_PROVIDER_SITE_OTHER): Payer: No Typology Code available for payment source | Admitting: Nurse Practitioner

## 2022-07-24 ENCOUNTER — Encounter: Payer: Self-pay | Admitting: Nurse Practitioner

## 2022-07-24 VITALS — BP 154/92 | HR 84 | Temp 97.7°F | Ht 64.0 in | Wt 306.5 lb

## 2022-07-24 DIAGNOSIS — I10 Essential (primary) hypertension: Secondary | ICD-10-CM | POA: Diagnosis not present

## 2022-07-24 DIAGNOSIS — B9689 Other specified bacterial agents as the cause of diseases classified elsewhere: Secondary | ICD-10-CM

## 2022-07-24 DIAGNOSIS — N760 Acute vaginitis: Secondary | ICD-10-CM | POA: Diagnosis not present

## 2022-07-24 DIAGNOSIS — M5442 Lumbago with sciatica, left side: Secondary | ICD-10-CM

## 2022-07-24 MED ORDER — CYCLOBENZAPRINE HCL 5 MG PO TABS: 5.0000 mg | ORAL_TABLET | Freq: Three times a day (TID) | ORAL | 1 refills | Status: DC | PRN

## 2022-07-24 MED ORDER — METRONIDAZOLE 500 MG PO TABS: 500.0000 mg | ORAL_TABLET | Freq: Two times a day (BID) | ORAL | 0 refills | Status: AC

## 2022-07-24 NOTE — Assessment & Plan Note (Signed)
Provided education that treatment is not necessary if patient is asymptomatic, however patient is very concerned regarding this finding and wants to undergo treatment.  We will treat her with course of metronidazole 500 mg/twice daily x 1 week, patient educated to avoid alcohol while taking medication.  She reports understanding.

## 2022-07-24 NOTE — Progress Notes (Signed)
Established Patient Office Visit  Subjective   Patient ID: Morgan Romero, female    DOB: 07/31/1976  Age: 46 y.o. MRN: [id number]  Chief Complaint  Patient presents with   Follow-up   Back Pain    Patient arrives today for the above.  She reports that her low back pain especially on the left side is flaring up and she is experiencing sciatica.  She denies any weakness or sensory changes that left leg.  Took Advil this morning for pain management.  She recently underwent bariatric gastric sleeve surgery and tolerated the procedure well.  She reports she has noticed she is lost some weight.  She does have hypertension and is currently on lisinopril-hydrochlorothiazide 20-12.5 mg tablets, she takes 1/day.  She had vaginal exam at OB/GYN recently and reports that results are positive for bacterial vaginosis.  She would like to be treated for this if possible.She is not currently experiencing vaginal symptoms but would still like to undergo treatment.    Review of Systems  Respiratory:  Negative for shortness of breath.   Cardiovascular:  Negative for chest pain.  Musculoskeletal:  Positive for back pain.  Neurological:  Negative for tingling and weakness.      Objective:     BP (!) 154/92   Pulse 84   Temp 97.7 F (36.5 C) (Oral)   Ht 5\' 4"  (1.626 m)   Wt (!) 306 lb 8 oz (139 kg)   SpO2 96%   BMI 52.61 kg/m  BP Readings from Last 3 Encounters:  07/24/22 (!) 154/92  07/13/22 (!) 168/92  06/23/22 (!) 145/63   Wt Readings from Last 3 Encounters:  07/24/22 (!) 306 lb 8 oz (139 kg)  07/13/22 (!) 311 lb 6.4 oz (141.3 kg)  07/07/22 (!) 311 lb 9.6 oz (141.3 kg)      Physical Exam Vitals reviewed.  Constitutional:      General: She is not in acute distress.    Appearance: Normal appearance.  HENT:     Head: Normocephalic and atraumatic.  Neck:     Vascular: No carotid bruit.  Cardiovascular:     Rate and Rhythm: Normal rate and regular rhythm.     Pulses:  Normal pulses.     Heart sounds: Normal heart sounds.  Pulmonary:     Effort: Pulmonary effort is normal.     Breath sounds: Normal breath sounds.  Skin:    General: Skin is warm and dry.  Neurological:     General: No focal deficit present.     Mental Status: She is alert and oriented to person, place, and time.  Psychiatric:        Mood and Affect: Mood normal.        Behavior: Behavior normal.        Judgment: Judgment normal.      No results found for any visits on 07/24/22.    The 10-year ASCVD risk score (Arnett DK, et al., 2019) is: 1.4%    Assessment & Plan:   Problem List Items Addressed This Visit       Cardiovascular and Mediastinum   Hypertension    Chronic, blood pressure elevated today.  Patient is in pain she also admitted to taking NSAIDs this morning.  For now we will maintain her on current antihypertensive, she will check blood pressure at home and send me a MyChart message over the next week.  If elevated will need to consider increasing lisinopril-hydrochlorothiazide dose.  Nervous and Auditory   Acute left-sided low back pain with left-sided sciatica - Primary    Start Flexeril 5 mg tablets 3 times a day as needed, patient educated to avoid driving or operating heavy machinery.  Patient also educated to let me know if symptoms persist or worsen especially if she experiences sensory changes or weakness.  She reports understanding.      Relevant Medications   cyclobenzaprine (FLEXERIL) 5 MG tablet     Genitourinary   Bacterial vaginosis    Provided education that treatment is not necessary if patient is asymptomatic, however patient is very concerned regarding this finding and wants to undergo treatment.  We will treat her with course of metronidazole 500 mg/twice daily x 1 week, patient educated to avoid alcohol while taking medication.  She reports understanding.      Relevant Medications   metroNIDAZOLE (FLAGYL) 500 MG tablet    Return  in about 3 months (around 10/24/2022) for f/u with [place] [facility].    Elenore Paddy, NP

## 2022-07-24 NOTE — Assessment & Plan Note (Signed)
Chronic, blood pressure elevated today.  Patient is in pain she also admitted to taking NSAIDs this morning.  For now we will maintain her on current antihypertensive, she will check blood pressure at home and send me a MyChart message over the next week.  If elevated will need to consider increasing lisinopril-hydrochlorothiazide dose.

## 2022-07-24 NOTE — Assessment & Plan Note (Signed)
Start Flexeril 5 mg tablets 3 times a day as needed, patient educated to avoid driving or operating heavy machinery.  Patient also educated to let me know if symptoms persist or worsen especially if she experiences sensory changes or weakness.  She reports understanding.

## 2022-07-25 ENCOUNTER — Other Ambulatory Visit (HOSPITAL_COMMUNITY): Payer: Self-pay

## 2022-07-27 ENCOUNTER — Other Ambulatory Visit (HOSPITAL_COMMUNITY): Payer: Self-pay

## 2022-08-06 ENCOUNTER — Encounter: Payer: Self-pay | Admitting: Nurse Practitioner

## 2022-08-11 ENCOUNTER — Ambulatory Visit (INDEPENDENT_AMBULATORY_CARE_PROVIDER_SITE_OTHER): Payer: No Typology Code available for payment source | Admitting: Advanced Practice Midwife

## 2022-08-11 ENCOUNTER — Encounter: Payer: Self-pay | Admitting: Advanced Practice Midwife

## 2022-08-11 VITALS — BP 127/76 | HR 78 | Ht 64.0 in | Wt 293.0 lb

## 2022-08-11 DIAGNOSIS — Z3046 Encounter for surveillance of implantable subdermal contraceptive: Secondary | ICD-10-CM | POA: Diagnosis not present

## 2022-08-11 DIAGNOSIS — Z30431 Encounter for routine checking of intrauterine contraceptive device: Secondary | ICD-10-CM

## 2022-08-11 DIAGNOSIS — Z975 Presence of (intrauterine) contraceptive device: Secondary | ICD-10-CM | POA: Diagnosis not present

## 2022-08-11 NOTE — Progress Notes (Signed)
46 y.o GYN presents for IUD String Check and Nexplanon removal

## 2022-08-11 NOTE — Progress Notes (Signed)
   GYNECOLOGY CLINIC PROGRESS NOTE  History:  46 y.o. G0 here at [facility] today for today for IUD string check; Mirena IUD was placed  07/13/22. Pt had expired Nexplanon but procedure was not performed on 07/13/22 due to time constraints.  Plan for Nexplanon removal today with IUD string check. No complaints about the IUD, no concerning side effects.  The following portions of the patient's history were reviewed and updated as appropriate: allergies, current medications, past family history, past medical history, past social history, past surgical history and problem list. Last pap smear on 07/13/22 was normal, negative HRHPV.  Review of Systems:  Pertinent items are noted in HPI.   Objective:  Physical Exam Blood pressure 127/76, pulse 78, height 5\' 4"  (1.626 m), weight 293 lb (132.9 kg). Gen: NAD Abd: Soft, nontender and nondistended Pelvic: Normal appearing external genitalia; normal appearing vaginal mucosa and cervix.  IUD strings visualized, about 4 cm in length outside cervix.    Nexplanon Removal Patient identified, informed consent performed, consent signed.   Appropriate time out taken. Nexplanon site identified.  Area prepped in usual sterile fashon. One ml of 1% lidocaine was used to anesthetize the area at the distal end of the implant. A small stab incision was made right beside the implant on the distal portion.  The Nexplanon rod was grasped using hemostats and removed without difficulty.  There was minimal blood loss. There were no complications.  3 ml of 1% lidocaine was injected around the incision for post-procedure analgesia.  Steri-strips were applied over the small incision.  A pressure bandage was applied to reduce any bruising.  The patient tolerated the procedure well and was given post procedure instructions.   Assessment & Plan:   1. IUD check up --IUD string normal, no problems with IUD --Pt to f/u for annual exam, Pap in 5 years, IUD effective x 8 years, or can  be removed if pt plans a pregnancy.  2. Nexplanon removal --Nexplanon removed without difficulty, see procedure note above    Return in about 1 year (around 08/12/2023) for annual exam.   10/12/2023, CNM 5:11 PM

## 2022-08-18 ENCOUNTER — Encounter: Payer: No Typology Code available for payment source | Attending: Surgery | Admitting: Skilled Nursing Facility1

## 2022-08-18 ENCOUNTER — Encounter: Payer: Self-pay | Admitting: Skilled Nursing Facility1

## 2022-08-18 DIAGNOSIS — Z6841 Body Mass Index (BMI) 40.0 and over, adult: Secondary | ICD-10-CM | POA: Insufficient documentation

## 2022-08-18 NOTE — Progress Notes (Signed)
Bariatric Nutrition Follow-Up Visit Medical Nutrition Therapy    NUTRITION ASSESSMENT  Surgery date: 06/22/2022 Surgery type: sleeve  Anthropometrics  Start weight at [facility]: 337 lbs (date: 04/17/2022)  Height: 64 in Weight today: 291 lbs. BMI: 50.04 kg/m2     Clinical  Medical hx: HTN, Major depressive disorder, GAD, ADHD Medications: see list  Labs: LDL 112, vitamin D 24.6 Notable signs/symptoms: none identified  Any previous deficiencies? No   Body Composition Scale 07/07/2022  Current Body Weight 311.6  Total Body Fat % 50.0  Visceral Fat 21  Fat-Free Mass % 49.9   Total Body Water % 39.4  Muscle-Mass lbs 32.3  BMI 53.4  Body Fat Displacement          Torso  lbs 96.8         Left Leg  lbs 19.3         Right Leg  lbs 19.3         Left Arm  lbs          Right Arm   lbs      Lifestyle & Dietary Hx  Pt states she is not having issues foods but yogurt is easier.  Pt states she plans on incorporating yoga.     Estimated daily fluid intake: 60 oz Estimated daily protein intake: 60 g Supplements: multi and calcium Current average weekly physical activity: walking 7 days a week 15 minutes    24-Hr Dietary Recall First Meal: protein shake Snack:  Second Meal: yogurt Snack:  sometimes [place] bacon Third Meal: chicken + cream of chicken soup or baked beans  Snack:  Beverages: water, gatorade zero  Post-Op Goals/ Signs/ Symptoms Using straws: no Drinking while eating: no Chewing/swallowing difficulties: no Changes in vision: no Changes to mood/headaches: no Hair loss/changes to skin/nails: no Difficulty focusing/concentrating: n Sweating: no Limb weakness: no Dizziness/lightheadedness: no Palpitations: no  Carbonated/caffeinated beverages: no N/V/D/C/Gas: no Abdominal pain: no Dumping syndrome: no    NUTRITION DIAGNOSIS  Overweight/obesity ([place]-3.3) related to past poor dietary habits and physical inactivity as evidenced by completed bariatric surgery  and following dietary guidelines for continued weight loss and healthy nutrition status.     NUTRITION INTERVENTION Nutrition counseling (C-1) and education (E-2) to facilitate bariatric surgery goals, including: Diet advancement to the next phase (phase 4) now including non starchy vegetables The importance of consuming adequate calories as well as certain nutrients daily due to the body's need for essential vitamins, minerals, and fats The importance of daily physical activity and to reach a goal of at least 150 minutes of moderate to vigorous physical activity weekly (or as directed by their physician) due to benefits such as increased musculature and improved lab values The importance of intuitive eating specifically learning hunger-satiety cues and understanding the importance of learning a new body: The importance of mindful eating to avoid grazing behaviors   Goals: -Continue to aim for a minimum of 64 fluid ounces 7 days a week with at least 30 ounces being plain water  -Eat non-starchy vegetables 2 times a day 7 days a week  -Start out with soft cooked vegetables today and tomorrow; if tolerated begin to eat raw vegetables or cooked including salads  -Eat your 3 ounces of protein first then start in on your non-starchy vegetables; once you understand how much of your meal leads to satisfaction and not full while still eating 3 ounces of protein and non-starchy vegetables you can eat them in any order   -Continue to  aim for 30 minutes of activity at least 5 times a week  -Do NOT cook with/add to your food: alfredo sauce, cheese sauce, barbeque sauce, ketchup, fat back, butter, bacon grease, grease, Crisco, OR SUGAR  -take an iron with your multi since it is the soft chew it does not contain iron   Handouts Provided Include  Phase 4  Learning Style & Readiness for Change Teaching method utilized: Visual & Auditory  Demonstrated degree of understanding via: Teach Back  Readiness  Level: action Barriers to learning/adherence to lifestyle change: none identified   RD's Notes for Next Visit Assess adherence to pt chosen goals    MONITORING & EVALUATION Dietary intake, weekly physical activity, body weight  Next Steps Patient is to follow-up in 3-4 months

## 2022-08-27 ENCOUNTER — Other Ambulatory Visit: Payer: Self-pay | Admitting: Nurse Practitioner

## 2022-08-27 DIAGNOSIS — I10 Essential (primary) hypertension: Secondary | ICD-10-CM

## 2022-09-02 ENCOUNTER — Other Ambulatory Visit: Payer: Self-pay

## 2022-09-02 ENCOUNTER — Encounter: Payer: Self-pay | Admitting: Nurse Practitioner

## 2022-09-02 DIAGNOSIS — I10 Essential (primary) hypertension: Secondary | ICD-10-CM

## 2022-09-02 MED ORDER — LISINOPRIL-HYDROCHLOROTHIAZIDE 20-12.5 MG PO TABS: 1.0000 | ORAL_TABLET | Freq: Every day | ORAL | 2 refills | Status: DC

## 2022-09-10 ENCOUNTER — Ambulatory Visit: Payer: No Typology Code available for payment source | Admitting: Nurse Practitioner

## 2022-09-27 ENCOUNTER — Other Ambulatory Visit: Payer: Self-pay | Admitting: Nurse Practitioner

## 2022-09-27 DIAGNOSIS — F429 Obsessive-compulsive disorder, unspecified: Secondary | ICD-10-CM

## 2022-09-27 DIAGNOSIS — F909 Attention-deficit hyperactivity disorder, unspecified type: Secondary | ICD-10-CM

## 2022-09-27 DIAGNOSIS — F419 Anxiety disorder, unspecified: Secondary | ICD-10-CM

## 2022-10-04 ENCOUNTER — Encounter: Payer: Self-pay | Admitting: Nurse Practitioner

## 2022-10-16 ENCOUNTER — Ambulatory Visit: Payer: No Typology Code available for payment source | Admitting: Nurse Practitioner

## 2022-11-10 ENCOUNTER — Ambulatory Visit: Payer: No Typology Code available for payment source

## 2022-11-18 ENCOUNTER — Ambulatory Visit: Payer: No Typology Code available for payment source | Admitting: Skilled Nursing Facility1

## 2022-12-03 ENCOUNTER — Other Ambulatory Visit: Payer: Self-pay | Admitting: Nurse Practitioner

## 2022-12-03 DIAGNOSIS — I10 Essential (primary) hypertension: Secondary | ICD-10-CM

## 2022-12-10 ENCOUNTER — Telehealth: Payer: Self-pay | Admitting: Nurse Practitioner

## 2022-12-10 DIAGNOSIS — I10 Essential (primary) hypertension: Secondary | ICD-10-CM

## 2022-12-10 MED ORDER — LISINOPRIL-HYDROCHLOROTHIAZIDE 20-12.5 MG PO TABS: 1.0000 | ORAL_TABLET | Freq: Every day | ORAL | 0 refills | Status: DC

## 2022-12-10 NOTE — Telephone Encounter (Signed)
Patient called and stated that she needs a refill on her medication lisinopril-hydrochlorothiazide (ZESTORETIC) 20-12.5 MG tablet. Patient stated that she always has problems getting a refill sent in for this medication and is requesting if the quantity could be increased. Best callback number is [phone]. Pharmacy on file is  [place], [place]

## 2022-12-10 NOTE — Telephone Encounter (Signed)
Per chart pt was due back in November "2023" did not come. Sent in 30 day supply until appt.Marland KitchenJohny Romero

## 2022-12-17 ENCOUNTER — Encounter: Payer: No Typology Code available for payment source | Attending: Surgery | Admitting: Skilled Nursing Facility1

## 2022-12-17 ENCOUNTER — Encounter: Payer: Self-pay | Admitting: Skilled Nursing Facility1

## 2022-12-17 VITALS — Ht 64.0 in | Wt 256.3 lb

## 2022-12-17 DIAGNOSIS — E669 Obesity, unspecified: Secondary | ICD-10-CM | POA: Insufficient documentation

## 2022-12-17 NOTE — Progress Notes (Signed)
Bariatric Nutrition Follow-Up Visit Medical Nutrition Therapy    NUTRITION ASSESSMENT  Surgery date: 06/22/2022 Surgery type: sleeve  Anthropometrics  Start weight at [facility]: 337 lbs (date: 04/17/2022)  Height: 64 in Weight today: 256.3 lbs. BMI: 43.99 kg/m2     Clinical  Medical hx: HTN, Major depressive disorder, GAD, ADHD Medications: see list  Labs: LDL 112, vitamin D 24.6 Notable signs/symptoms: none identified  Any previous deficiencies? No   Body Composition Scale 07/07/2022  Current Body Weight 311.6  Total Body Fat % 50.0  Visceral Fat 21  Fat-Free Mass % 49.9   Total Body Water % 39.4  Muscle-Mass lbs 32.3  BMI 53.4  Body Fat Displacement          Torso  lbs 96.8         Left Leg  lbs 19.3         Right Leg  lbs 19.3         Left Arm  lbs          Right Arm   lbs      Lifestyle & Dietary Hx  Pt states [place] restaurant sauce gave her acid reflux.  Pt states she is not super into plain water.  Pt states she got a yoga mat for her birthday. Pt states she enjoys yoga.     Estimated daily fluid intake: 60 oz Estimated daily protein intake: 60 g Supplements: multi (without iron) and calcium and 65 mg iron every other day Current average weekly physical activity: yoga (goal is to do 3 days a week and stretching joined a video subscription)  24-Hr Dietary Recall First Meal: protein shake or greek yogurt  Snack:  Second Meal: chicken wrap or soup  Snack:  sometimes [place] bacon Third Meal: chicken peppers mushrooms and onions or red lentil pasta or salmon Snack:  Beverages: water + flavoring, gatorade zero  Post-Op Goals/ Signs/ Symptoms Using straws: no Drinking while eating: no Chewing/swallowing difficulties: no Changes in vision: no Changes to mood/headaches: no Hair loss/changes to skin/nails: no Difficulty focusing/concentrating: n Sweating: no Limb weakness: no Dizziness/lightheadedness: no Palpitations: no  Carbonated/caffeinated  beverages: no N/V/D/C/Gas: no Abdominal pain: no Dumping syndrome: no    NUTRITION DIAGNOSIS  Overweight/obesity ([place]-3.3) related to past poor dietary habits and physical inactivity as evidenced by completed bariatric surgery and following dietary guidelines for continued weight loss and healthy nutrition status.     NUTRITION INTERVENTION Nutrition counseling (C-1) and education (E-2) to facilitate bariatric surgery goals, including: Diet advancement to the next phase (phase 4) now including non starchy vegetables The importance of consuming adequate calories as well as certain nutrients daily due to the body's need for essential vitamins, minerals, and fats The importance of daily physical activity and to reach a goal of at least 150 minutes of moderate to vigorous physical activity weekly (or as directed by their physician) due to benefits such as increased musculature and improved lab values The importance of intuitive eating specifically learning hunger-satiety cues and understanding the importance of learning a new body: The importance of mindful eating to avoid grazing behaviors  Pt is encouraged to consume less processed foods, as they have the potential to negatively affect overall health. Processed foods often destroy or remove nutrients from the product and/or have added salt, sugar, and saturated fats. Although not all processed foods are a concern, it is advised to have the majority of our foods be unprocessed or minimally processed as opposed to processed and ultra-processed. An  example of this would be a whole apple (unprocessed), prepackaged apple slices with no additives (minimally processed), unsweetened applesauce (processed), and sweetened applesauce or apple juice with high fructose corn syrup (ultra-processed). For further information please visit https://www.nutritionletter.HollywoodSale.dk. Why you need complex carbohydrates: Whole grains and other  complex carbohydrates are required to have a healthy diet. Whole grains provide fiber which can help with blood glucose levels and help keep you satiated. Fruits and starchy vegetables provide essential vitamins and minerals required for immune function, eyesight support, brain support, bone density, wound healing and many other functions within the body. According to the current evidenced based 2020-2025 Dietary Guidelines for Americans, complex carbohydrates are part of a healthy eating pattern which is associated with a decreased risk for type 2 diabetes, cancers, and cardiovascular disease.  Purpose of hydration: Water makes up over 50% of your total body water, and is part of many organs throughout the body. Water is essential to transport digested nutrients, regulate body temperature, rid the body of waste products, and protects joints and the spinal cord. When not properly hydrated you will begin to experience headaches, cramps and dizziness. Further dehydration can result in rapid heart rate, shock, oliguria, and may cause seizures.  https://www.merckmanuals.com/home/hormonal-and-metabolic-disordehttps://www.usgs.gov/special-topic/water-science-school/science/water-you-water-and-human-body?qt-science_center_objects=0#qt-science_center_objectsrs/water-balance/about-body-water HistoricalGrowth.gl https://www.stevens.org/ PimpTShirt.fi https://www.health.InvestmentBrowse.at    Handouts Provided Include  6 month handout   Learning Style & Readiness for Change Teaching method utilized: Visual & Auditory  Demonstrated degree of understanding via: Teach Back  Readiness Level: action Barriers to learning/adherence to lifestyle change: none identified   RD's Notes for Next Visit Assess adherence to pt chosen  goals    MONITORING & EVALUATION Dietary intake, weekly physical activity, body weight  Next Steps Patient is to follow-up in 3-4 months

## 2023-01-02 ENCOUNTER — Other Ambulatory Visit: Payer: Self-pay | Admitting: Nurse Practitioner

## 2023-01-02 DIAGNOSIS — E876 Hypokalemia: Secondary | ICD-10-CM

## 2023-01-02 DIAGNOSIS — I10 Essential (primary) hypertension: Secondary | ICD-10-CM

## 2023-01-04 ENCOUNTER — Other Ambulatory Visit: Payer: Self-pay | Admitting: *Deleted

## 2023-01-04 DIAGNOSIS — I10 Essential (primary) hypertension: Secondary | ICD-10-CM

## 2023-01-04 DIAGNOSIS — E876 Hypokalemia: Secondary | ICD-10-CM

## 2023-01-04 MED ORDER — POTASSIUM CHLORIDE CRYS ER 10 MEQ PO TBCR: 10.0000 meq | EXTENDED_RELEASE_TABLET | Freq: Every day | ORAL | 0 refills | Status: DC

## 2023-01-04 MED ORDER — LISINOPRIL-HYDROCHLOROTHIAZIDE 20-12.5 MG PO TABS: 1.0000 | ORAL_TABLET | Freq: Every day | ORAL | 0 refills | Status: DC

## 2023-03-05 ENCOUNTER — Ambulatory Visit (INDEPENDENT_AMBULATORY_CARE_PROVIDER_SITE_OTHER): Payer: No Typology Code available for payment source | Admitting: Nurse Practitioner

## 2023-03-05 VITALS — BP 130/84 | HR 75 | Temp 97.8°F | Wt 243.1 lb

## 2023-03-05 DIAGNOSIS — I10 Essential (primary) hypertension: Secondary | ICD-10-CM

## 2023-03-05 DIAGNOSIS — M25552 Pain in left hip: Secondary | ICD-10-CM

## 2023-03-05 DIAGNOSIS — F331 Major depressive disorder, recurrent, moderate: Secondary | ICD-10-CM | POA: Diagnosis not present

## 2023-03-05 MED ORDER — LISINOPRIL-HYDROCHLOROTHIAZIDE 20-12.5 MG PO TABS: 1.0000 | ORAL_TABLET | Freq: Every day | ORAL | 0 refills | Status: DC

## 2023-03-05 NOTE — Assessment & Plan Note (Signed)
Chronic, patient on Wellbutrin for 150 mg daily.  PHQ-9: 0, GAD-7: 17.  Denies suicidal ideation.  Offered referral to psychiatry but patient would prefer to treat with Wellbutrin alone at this time.

## 2023-03-05 NOTE — Patient Instructions (Signed)
Solumedrol/methylprednisolone

## 2023-03-05 NOTE — Progress Notes (Signed)
Established Patient Office Visit  Subjective   Patient ID: Morgan Romero, female    DOB: July 08, 1976  Age: 47 y.o. MRN: [id number]  Chief Complaint  Patient presents with   Medical Management of Chronic Issues    Hip pain and tailbone  Also talk about mental health     HTN: Compliant with lisinopril-hydrochlorothiazide 20-12.5 mg, 1 tab by mouth daily.  Needs refill.  Tolerating well thus far. Depression/Anxiety: On Wellbutrin 4 to 50 mg daily, tolerating well feels that mood is stable.  Will take Xanax as needed, no longer seeing previous psychiatrist. Arthralgia: Reports left hip pain with external rotation only as well as tailbone pain has been going on for about 1 month.  Exacerbated by sitting down for prolonged periods of time.  Has but the question for her chair as well as trying to stand up more frequently.  Takes Tylenol as needed.  Pain in tailbone 7/10, hip 9/10.    Review of Systems  Psychiatric/Behavioral:  Negative for depression and suicidal ideas. The patient is nervous/anxious.       Objective:     BP 130/84   Pulse 75   Temp 97.8 F (36.6 C) (Temporal)   Wt 243 lb 2 oz (110.3 kg)   SpO2 98%   BMI 41.73 kg/m  BP Readings from Last 3 Encounters:  03/05/23 130/84  08/11/22 127/76  07/24/22 (!) 154/92   Wt Readings from Last 3 Encounters:  03/05/23 243 lb 2 oz (110.3 kg)  12/17/22 256 lb 4.8 oz (116.3 kg)  08/18/22 291 lb 8 oz (132.2 kg)        03/05/2023    4:10 PM 07/24/2022    4:44 PM 07/13/2022    3:22 PM  PHQ9 SCORE ONLY  PHQ-9 Total Score 0 5 6      03/05/2023    4:33 PM  GAD 7 : Generalized Anxiety Score  Nervous, Anxious, on Edge 3  Control/stop worrying 3  Worry too much - different things 3  Trouble relaxing 3  Restless 3  Easily annoyed or irritable 2  Afraid - awful might happen 0  Total GAD 7 Score 17     Physical Exam Vitals reviewed.  Constitutional:      General: She is not in acute distress.    Appearance: Normal  appearance.  HENT:     Head: Normocephalic and atraumatic.  Neck:     Vascular: No carotid bruit.  Cardiovascular:     Rate and Rhythm: Normal rate and regular rhythm.     Pulses: Normal pulses.     Heart sounds: Normal heart sounds.  Pulmonary:     Effort: Pulmonary effort is normal.     Breath sounds: Normal breath sounds.  Musculoskeletal:     Lumbar back: No tenderness (in sacral area - mild).     Left hip: Normal. No tenderness or bony tenderness. Normal range of motion.  Skin:    General: Skin is warm and dry.  Neurological:     General: No focal deficit present.     Mental Status: She is alert and oriented to person, place, and time.  Psychiatric:        Mood and Affect: Mood normal.        Behavior: Behavior normal.        Judgment: Judgment normal.      No results found for any visits on 03/05/23.    The 10-year ASCVD risk score (Arnett DK, et al., 2019)  is: 1.1%    Assessment & Plan:   Problem List Items Addressed This Visit       Cardiovascular and Mediastinum   Hypertension    Chronic, Blood pressure reasonable on current regimen.  Refill on lisinopril-hydrochlorothiazide sent to patient's pharmacy today, patient to return to office when lab is open to have metabolic panel checked.      Relevant Medications   lisinopril-hydrochlorothiazide (ZESTORETIC) 20-12.5 MG tablet   Other Relevant Orders   Comprehensive metabolic panel   CBC     Other   Major depressive disorder, recurrent episode, moderate    Chronic, patient on Wellbutrin for 150 mg daily.  PHQ-9: 0, GAD-7: 17.  Denies suicidal ideation.  Offered referral to psychiatry but patient would prefer to treat with Wellbutrin alone at this time.      Left hip pain - Primary    Intermittent and ongoing for the last month, has been slowly improving.  [facility], she was encouraged to call their office to schedule appointment for further evaluation.        Return in about 6 months (around 09/04/2023) for CPE with Jennipher Weatherholtz.    Elenore PaddySARAH E Barlow Harrison, NP

## 2023-03-05 NOTE — Assessment & Plan Note (Signed)
Intermittent and ongoing for the last month, has been slowly improving.  She has seen orthopedist at [facility] in the past, she was encouraged to call their office to schedule appointment for further evaluation.

## 2023-03-05 NOTE — Assessment & Plan Note (Signed)
Chronic, Blood pressure reasonable on current regimen.  Refill on lisinopril-hydrochlorothiazide sent to patient's pharmacy today, patient to return to office when lab is open to have metabolic panel checked.

## 2023-03-11 ENCOUNTER — Ambulatory Visit: Payer: No Typology Code available for payment source | Admitting: Nurse Practitioner

## 2023-03-22 ENCOUNTER — Other Ambulatory Visit (HOSPITAL_COMMUNITY)
Admission: RE | Admit: 2023-03-22 | Discharge: 2023-03-22 | Disposition: A | Discharge status: Home / Self Care | Payer: No Typology Code available for payment source | Source: Ambulatory Visit | Attending: Advanced Practice Midwife | Admitting: Advanced Practice Midwife

## 2023-03-22 ENCOUNTER — Ambulatory Visit (INDEPENDENT_AMBULATORY_CARE_PROVIDER_SITE_OTHER): Payer: No Typology Code available for payment source | Admitting: Advanced Practice Midwife

## 2023-03-22 ENCOUNTER — Encounter: Payer: Self-pay | Admitting: Advanced Practice Midwife

## 2023-03-22 VITALS — BP 145/83 | HR 78 | Wt 232.4 lb

## 2023-03-22 DIAGNOSIS — Z113 Encounter for screening for infections with a predominantly sexual mode of transmission: Secondary | ICD-10-CM | POA: Diagnosis present

## 2023-03-22 DIAGNOSIS — N898 Other specified noninflammatory disorders of vagina: Secondary | ICD-10-CM | POA: Diagnosis not present

## 2023-03-22 NOTE — Progress Notes (Signed)
   GYNECOLOGY PROGRESS NOTE  History:  47 y.o. G0P0000 presents to [facility] office today for problem gyn visit. She reports vaginal irritation, malodorous discharge x 1 month, has had before with BV.  She denies h/a, dizziness, shortness of breath, n/v, or fever/chills.    The following portions of the patient's history were reviewed and updated as appropriate: allergies, current medications, past family history, past medical history, past social history, past surgical history and problem list. Last pap smear on 07/13/2022 was normal, neg HRHPV.  Health Maintenance Due  Topic Date Due   COVID-19 Vaccine (1) Never done   HIV Screening  Never done   Hepatitis C Screening  Never done   DTaP/Tdap/Td (1 - Tdap) Never done   COLONOSCOPY (Pts 45-63yrs Insurance coverage will need to be confirmed)  Never done     Review of Systems:  Pertinent items are noted in HPI.   Objective:  Physical Exam Blood pressure (!) 145/83, pulse 78, weight 232 lb 6.4 oz (105.4 kg). VS reviewed, nursing note reviewed,  Constitutional: well developed, well nourished, no distress HEENT: normocephalic CV: normal rate Pulm/chest wall: normal effort Breast Exam: deferred Abdomen: soft Neuro: alert and oriented x 3 Skin: warm, dry Psych: affect normal Pelvic exam: On visual inspection small area at perineum only at  introitus with mild errythema. No lesions.   Assessment & Plan:  1. Vaginal discharge --vaginal irrigation with malodorous discharge --Small area of erythema at introitus, no lesions  - Cervicovaginal ancillary only( [facility])  2. Routine screening for STI (sexually transmitted infection)  - Cervicovaginal ancillary only( [facility]) - Hepatitis B surface antigen - Hepatitis C antibody - HIV Antibody (routine testing w rflx) - RPR   No follow-ups on file.   Sharen Counter, CNM 4:21 PM

## 2023-03-22 NOTE — Progress Notes (Signed)
Pt presents for malodorous vaginal discharge and irritation.  She requests all STD testing.  Normal pap 07/13/22

## 2023-03-23 ENCOUNTER — Other Ambulatory Visit: Payer: No Typology Code available for payment source

## 2023-03-23 ENCOUNTER — Encounter: Payer: Self-pay | Admitting: Skilled Nursing Facility1

## 2023-03-23 ENCOUNTER — Encounter: Payer: No Typology Code available for payment source | Attending: Surgery | Admitting: Skilled Nursing Facility1

## 2023-03-23 VITALS — Ht 64.0 in | Wt 231.6 lb

## 2023-03-23 DIAGNOSIS — E669 Obesity, unspecified: Secondary | ICD-10-CM | POA: Diagnosis not present

## 2023-03-23 DIAGNOSIS — I10 Essential (primary) hypertension: Secondary | ICD-10-CM | POA: Diagnosis not present

## 2023-03-23 LAB — CBC
HCT: 44.1 % (ref 36.0–46.0)
Hemoglobin: 14.8 g/dL (ref 12.0–15.0)
MCHC: 33.7 g/dL (ref 30.0–36.0)
MCV: 88.7 fl (ref 78.0–100.0)
Platelets: 216 10*3/uL (ref 150.0–400.0)
RBC: 4.97 Mil/uL (ref 3.87–5.11)
RDW: 13.9 % (ref 11.5–15.5)
WBC: 6.8 10*3/uL (ref 4.0–10.5)

## 2023-03-23 LAB — COMPREHENSIVE METABOLIC PANEL
ALT: 18 U/L (ref 0–35)
AST: 20 U/L (ref 0–37)
Albumin: 4.5 g/dL (ref 3.5–5.2)
Alkaline Phosphatase: 74 U/L (ref 39–117)
BUN: 12 mg/dL (ref 6–23)
CO2: 29 mEq/L (ref 19–32)
Calcium: 10 mg/dL (ref 8.4–10.5)
Chloride: 101 mEq/L (ref 96–112)
Creatinine, Ser: 0.96 mg/dL (ref 0.40–1.20)
GFR: 71.07 mL/min (ref >60.00)
Glucose, Bld: 90 mg/dL (ref 70–99)
Potassium: 3.7 mEq/L (ref 3.5–5.1)
Sodium: 138 mEq/L (ref 135–145)
Total Bilirubin: 0.4 mg/dL (ref 0.2–1.2)
Total Protein: 7.9 g/dL (ref 6.0–8.3)

## 2023-03-23 LAB — HEPATITIS B SURFACE ANTIGEN: Hepatitis B Surface Ag: NEGATIVE

## 2023-03-23 LAB — HIV ANTIBODY (ROUTINE TESTING W REFLEX): HIV Screen 4th Generation wRfx: NONREACTIVE

## 2023-03-23 LAB — RPR: RPR Ser Ql: NONREACTIVE

## 2023-03-23 LAB — HEPATITIS C ANTIBODY: Hep C Virus Ab: NONREACTIVE

## 2023-03-23 NOTE — Progress Notes (Signed)
Bariatric Nutrition Follow-Up Visit Medical Nutrition Therapy    NUTRITION ASSESSMENT  Surgery date: 06/22/2022 Surgery type: sleeve  Anthropometrics  Start weight at [facility]: 337 lbs (date: 04/17/2022)  Height: 64 in Weight today: 231.6 pounds   Clinical  Medical hx: HTN, Major depressive disorder, GAD, ADHD Medications: see list  Labs: LDL 112, vitamin D 24.6 Notable signs/symptoms: none identified  Any previous deficiencies? No   Body Composition Scale 07/07/2022 03/23/2023  Current Body Weight 311.6 231.6  Total Body Fat % 50.0 43.5  Visceral Fat 21 14  Fat-Free Mass % 49.9 56.4   Total Body Water % 39.4 42.7  Muscle-Mass lbs 32.3 31.4  BMI 53.4 39.7  Body Fat Displacement           Torso  lbs 96.8 62.5         Left Leg  lbs 19.3 12.5         Right Leg  lbs 19.3 12.5         Left Arm  lbs  6.2         Right Arm   lbs  6.2     Lifestyle & Dietary Hx  Pt states her anxiety and OCD is up and needs to have her mental health medication managed by a psychiatrist.  Pt states she does sometimes struggle to eat throughout the day due to life happening.  Pt states she loves spinach.   Estimated daily fluid intake: 60 oz Estimated daily protein intake: 60 g Supplements: multi (without iron) and calcium and 65 mg iron every other day Current average weekly physical activity:  started doing yoga classes 2 days a week  24-Hr Dietary Recall First Meal: protein shake or greek yogurt pr scrambled egg + mushroom + spinach Snack: almond  Second Meal: tuna or chicken salad  + butter lettuce or multigrain crackers Snack:  fit crunch or harvest snaps or strawberries sometimes beef jerky Third Meal: chicken peppers mushrooms and onions or red lentil pasta or salmon Snack:  Beverages: water + flavoring, gatorade zero, starbucks espresso iced coffee + sugar, some arnold palmer   Post-Op Goals/ Signs/ Symptoms Using straws: no Drinking while eating: no Chewing/swallowing  difficulties: no Changes in vision: no Changes to mood/headaches: no Hair loss/changes to skin/nails: no Difficulty focusing/concentrating: n Sweating: no Limb weakness: no Dizziness/lightheadedness: no Palpitations: no  Carbonated/caffeinated beverages: no N/V/D/C/Gas: no Abdominal pain: no Dumping syndrome: no    NUTRITION DIAGNOSIS  Overweight/obesity (-3.3) related to past poor dietary habits and physical inactivity as evidenced by completed bariatric surgery and following dietary guidelines for continued weight loss and healthy nutrition status.     NUTRITION INTERVENTION Nutrition counseling (C-1) and education (E-2) to facilitate bariatric surgery goals, including: Diet advancement to the next phase (phase 4) now including non starchy vegetables The importance of consuming adequate calories as well as certain nutrients daily due to the body's need for essential vitamins, minerals, and fats The importance of daily physical activity and to reach a goal of at least 150 minutes of moderate to vigorous physical activity weekly (or as directed by their physician) due to benefits such as increased musculature and improved lab values The importance of intuitive eating specifically learning hunger-satiety cues and understanding the importance of learning a new body: The importance of mindful eating to avoid grazing behaviors  Pt is encouraged to consume less processed foods, as they have the potential to negatively affect overall health. Processed foods often destroy or remove nutrients from the product  and/or have added salt, sugar, and saturated fats. Although not all processed foods are a concern, it is advised to have the majority of our foods be unprocessed or minimally processed as opposed to processed and ultra-processed. An example of this would be a whole apple (unprocessed), prepackaged apple slices with no additives (minimally processed), unsweetened applesauce (processed), and  sweetened applesauce or apple juice with high fructose corn syrup (ultra-processed). For further information please visit https://www.nutritionletter.FinancialAct.com.ee. Why you need complex carbohydrates: Whole grains and other complex carbohydrates are required to have a healthy diet. Whole grains provide fiber which can help with blood glucose levels and help keep you satiated. Fruits and starchy vegetables provide essential vitamins and minerals required for immune function, eyesight support, brain support, bone density, wound healing and many other functions within the body. According to the current evidenced based 2020-2025 Dietary Guidelines for Americans, complex carbohydrates are part of a healthy eating pattern which is associated with a decreased risk for type 2 diabetes, cancers, and cardiovascular disease.  Purpose of hydration: Water makes up over 50% of your total body water, and is part of many organs throughout the body. Water is essential to transport digested nutrients, regulate body temperature, rid the body of waste products, and protects joints and the spinal cord. When not properly hydrated you will begin to experience headaches, cramps and dizziness. Further dehydration can result in rapid heart rate, shock, oliguria, and may cause seizures.  https://www.merckmanuals.com/home/hormonal-and-metabolic-disordehttps://www.usgs.gov/special-topic/water-science-school/science/water-you-water-and-human-body?qt-science_center_objects=0#qt-science_center_objectsrs/water-balance/about-body-water FriendLock.it InvestmentInstructor.com.cy FurEliminator.es https://www.health.BasicFM.no  -try triscuit or crunchmaster cracker for a whole grain product  Handouts Previously  Provided Include  6 month handout   Learning Style & Readiness for Change Teaching method utilized: Visual & Auditory  Demonstrated degree of understanding via: Teach Back  Readiness Level: action Barriers to learning/adherence to lifestyle change: none identified   RD's Notes for Next Visit Assess adherence to pt chosen goals    MONITORING & EVALUATION Dietary intake, weekly physical activity, body weight  Next Steps Patient is to follow-up in Late July

## 2023-03-24 ENCOUNTER — Other Ambulatory Visit: Payer: Self-pay | Admitting: Nurse Practitioner

## 2023-03-24 ENCOUNTER — Encounter: Payer: Self-pay | Admitting: Advanced Practice Midwife

## 2023-03-24 DIAGNOSIS — I10 Essential (primary) hypertension: Secondary | ICD-10-CM

## 2023-03-24 LAB — CERVICOVAGINAL ANCILLARY ONLY
Bacterial Vaginitis (gardnerella): NEGATIVE
Candida Glabrata: NEGATIVE
Candida Vaginitis: NEGATIVE
Chlamydia: NEGATIVE
Comment: NEGATIVE
Comment: NEGATIVE
Comment: NEGATIVE
Comment: NEGATIVE
Comment: NEGATIVE
Comment: NORMAL
Neisseria Gonorrhea: NEGATIVE
Trichomonas: NEGATIVE

## 2023-03-24 MED ORDER — LISINOPRIL-HYDROCHLOROTHIAZIDE 20-12.5 MG PO TABS
1.0000 | ORAL_TABLET | Freq: Every day | ORAL | 1 refills | Status: DC
Start: 2023-03-24 — End: 2023-07-09

## 2023-03-28 ENCOUNTER — Other Ambulatory Visit (HOSPITAL_BASED_OUTPATIENT_CLINIC_OR_DEPARTMENT_OTHER): Payer: Self-pay | Admitting: Advanced Practice Midwife

## 2023-03-28 DIAGNOSIS — N898 Other specified noninflammatory disorders of vagina: Secondary | ICD-10-CM

## 2023-03-28 MED ORDER — TRIAMCINOLONE ACETONIDE 0.5 % EX OINT
1.0000 | TOPICAL_OINTMENT | Freq: Two times a day (BID) | CUTANEOUS | 0 refills | Status: DC
Start: 2023-03-28 — End: 2023-11-05

## 2023-04-10 ENCOUNTER — Other Ambulatory Visit: Payer: Self-pay | Admitting: Nurse Practitioner

## 2023-04-10 DIAGNOSIS — E876 Hypokalemia: Secondary | ICD-10-CM

## 2023-04-10 DIAGNOSIS — F429 Obsessive-compulsive disorder, unspecified: Secondary | ICD-10-CM

## 2023-04-10 DIAGNOSIS — F909 Attention-deficit hyperactivity disorder, unspecified type: Secondary | ICD-10-CM

## 2023-04-10 DIAGNOSIS — F419 Anxiety disorder, unspecified: Secondary | ICD-10-CM

## 2023-05-03 ENCOUNTER — Encounter: Payer: No Typology Code available for payment source | Admitting: Plastic Surgery

## 2023-06-29 ENCOUNTER — Encounter: Payer: No Typology Code available for payment source | Attending: Surgery | Admitting: Skilled Nursing Facility1

## 2023-06-29 ENCOUNTER — Encounter: Payer: Self-pay | Admitting: Skilled Nursing Facility1

## 2023-06-29 VITALS — Ht 64.0 in | Wt 218.3 lb

## 2023-06-29 DIAGNOSIS — E669 Obesity, unspecified: Secondary | ICD-10-CM

## 2023-06-29 NOTE — Progress Notes (Unsigned)
Bariatric Nutrition Follow-Up Visit Medical Nutrition Therapy    NUTRITION ASSESSMENT  Surgery date: 06/22/2022 Surgery type: sleeve  Anthropometrics  Start weight at [facility]: 337 lbs (date: 04/17/2022)  Height: 64 in Weight today: 231.6 pounds   Clinical  Medical hx: HTN, Major depressive disorder, GAD, ADHD Medications: see list  Labs: LDL 112, vitamin D 24.6 Notable signs/symptoms: headaches since before surgery  Any previous deficiencies? No   Body Composition Scale 07/07/2022 03/23/2023 06/29/2023  Current Body Weight 311.6 231.6 218.3  Total Body Fat % 50.0 43.5 42  Visceral Fat 21 14 13   Fat-Free Mass % 49.9 56.4 57.9   Total Body Water % 39.4 42.7 43.4  Muscle-Mass lbs 32.3 31.4 31.3  BMI 53.4 39.7 37.3  Body Fat Displacement            Torso  lbs 96.8 62.5 56.7         Left Leg  lbs 19.3 12.5 11.3         Right Leg  lbs 19.3 12.5 11.3         Left Arm  lbs  6.2 5.6         Right Arm   lbs  6.2 5.6     Lifestyle & Dietary Hx  Pt states she had a stall for a few months.  Pt states she wished she lost more weight but recognizes she has not worked out like she should.  Pt states she started talk therapy.   Estimated daily fluid intake: 60 oz Estimated daily protein intake: 60 g Supplements: multi (without iron) and calcium and 65 mg iron every other day Current average weekly physical activity:  started doing yoga classes 1 day a week, 1 day a week 2 miles   24-Hr Dietary Recall First Meal: iced coffee then yogurt + granola Snack: almonds or protein bars sometimes   Second Meal: cottage cheese and fruit Snack:  almonds or protein bar or watermelon and feta with balsamic and berries Third Meal: chicken peppers mushrooms cheese and onions Snack: sometimes greek yogurt bar and almonds Beverages: water + flavoring, gatorade zero, starbucks espresso iced coffee + sugar, some arnold palmer   Post-Op Goals/ Signs/ Symptoms Using straws: no Drinking while eating:  no Chewing/swallowing difficulties: no Changes in vision: no Changes to mood/headaches: no Hair loss/changes to skin/nails: no Difficulty focusing/concentrating: n Sweating: no Limb weakness: no Dizziness/lightheadedness: no Palpitations: no  Carbonated/caffeinated beverages: no N/V/D/C/Gas: no; stool softener/laxative sometimes Abdominal pain: no Dumping syndrome: no    NUTRITION DIAGNOSIS  Overweight/obesity (-3.3) related to past poor dietary habits and physical inactivity as evidenced by completed bariatric surgery and following dietary guidelines for continued weight loss and healthy nutrition status.     NUTRITION INTERVENTION Nutrition counseling (C-1) and education (E-2) to facilitate bariatric surgery goals, including: The importance of consuming adequate calories as well as certain nutrients daily due to the body's need for essential vitamins, minerals, and fats The importance of daily physical activity and to reach a goal of at least 150 minutes of moderate to vigorous physical activity weekly (or as directed by their physician) due to benefits such as increased musculature and improved lab values The importance of intuitive eating specifically learning hunger-satiety cues and understanding the importance of learning a new body: The importance of mindful eating to avoid grazing behaviors  Pt is encouraged to consume less processed foods, as they have the potential to negatively affect overall health. Processed foods often destroy or remove nutrients from the  product and/or have added salt, sugar, and saturated fats. Although not all processed foods are a concern, it is advised to have the majority of our foods be unprocessed or minimally processed as opposed to processed and ultra-processed. An example of this would be a whole apple (unprocessed), prepackaged apple slices with no additives (minimally processed), unsweetened applesauce (processed), and sweetened applesauce or  apple juice with high fructose corn syrup (ultra-processed). For further information please visit https://www.nutritionletter.SalaryStart.pl. Educated pt on the importance of consistently eating throughout the day: Sustained Energy Levels: Regular meals and snacks help maintain stable blood sugar levels, providing a steady source of energy throughout the day. This can prevent energy crashes and help you stay focused and alert.  Metabolism Regulation: Eating regularly can help keep your metabolism active and functioning efficiently. Consistent meals can prevent your body from going into "starvation mode," where it conserves energy and slows down metabolism.  Nutrient Absorption: Spacing out meals allows your body to better absorb and utilize nutrients from food. Eating throughout the day ensures that your body receives a continuous supply of essential vitamins, minerals, and other nutrients.  Appetite Control: Regular meals and snacks can help regulate appetite and prevent overeating. When you go too long without eating, you may become overly hungry and more likely to overindulge or make unhealthy food choices.  Blood Sugar Regulation: Eating at regular intervals helps prevent spikes and crashes in blood sugar levels. This is particularly important for individuals with diabetes or those at risk of developing diabetes.  Improved Mood and Concentration: Balanced, regular meals can positively impact your mood and cognitive function. Stable blood sugar levels support brain function, leading to better concentration, memory, and overall mental well-being.  Muscle Maintenance: Consuming protein-rich snacks or meals throughout the day can help support muscle repair and maintenance, especially for individuals who are physically active or engaged in strength training.  Overall, eating throughout the day supports your body's needs for energy, nutrients, and overall health, promoting a  balanced and sustainable approach to nutrition  -try triscuit or crunchmaster cracker for a whole grain product  -add a walk every week for   - Continue current physical activity with addition of walking dogs some days.   Handouts Previously Provided Include  6 month handout   Learning Style & Readiness for Change Teaching method utilized: Visual & Auditory  Demonstrated degree of understanding via: Teach Back  Readiness Level: action Barriers to learning/adherence to lifestyle change: none identified   RD's Notes for Next Visit Assess adherence to pt chosen goals    MONITORING & EVALUATION Dietary intake, weekly physical activity, body weight  Next Steps Patient is to follow-up with a call or email with any future questions or concerns

## 2023-07-09 ENCOUNTER — Other Ambulatory Visit: Payer: Self-pay | Admitting: Nurse Practitioner

## 2023-07-09 DIAGNOSIS — I10 Essential (primary) hypertension: Secondary | ICD-10-CM

## 2023-08-20 IMAGING — RF DG UGI W SINGLE CM
14 of 16 series · 15 of 24 positions shown · non-contrast
Comparison: None Available.

CLINICAL DATA: Preoperative for bariatric surgery, morbid obesity

EXAM:
DG UGI W SINGLE CM
TECHNIQUE: Scout radiograph was obtained. Single contrast examination was
performed using thin liquid barium. This exam was performed by Perest
Ourari, PA, and was supervised and interpreted by older Salo,
M.D.
FLUOROSCOPY:
Radiation Exposure Index (as provided by the fluoroscopic device):
189 mGy Kerma

[Series 4: t abdomen · 1 of 1 slices shown]
[im 1/1]
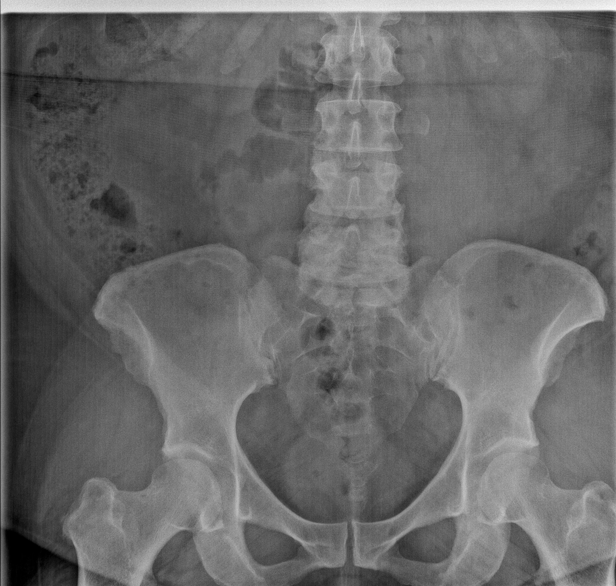

[Series 6: cp_standard · 1 of 236 frames shown (1 of 9)]
[frame 119/236]
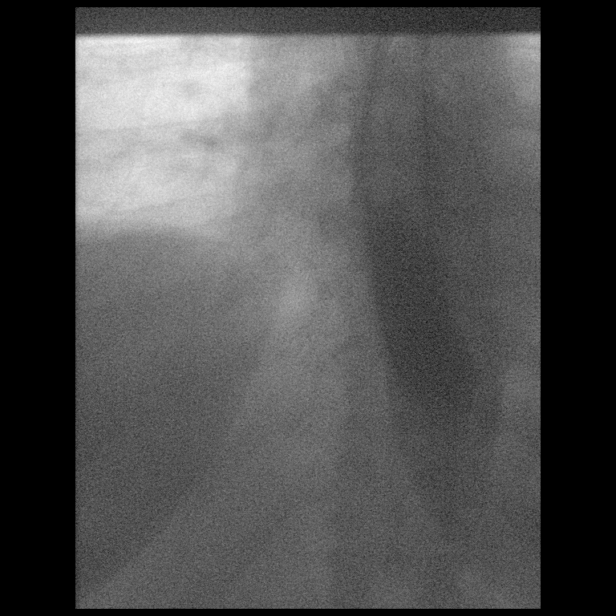

[Series 7: cp_standard · 1 of 253 frames shown (2 of 9)]
[frame 216/253]
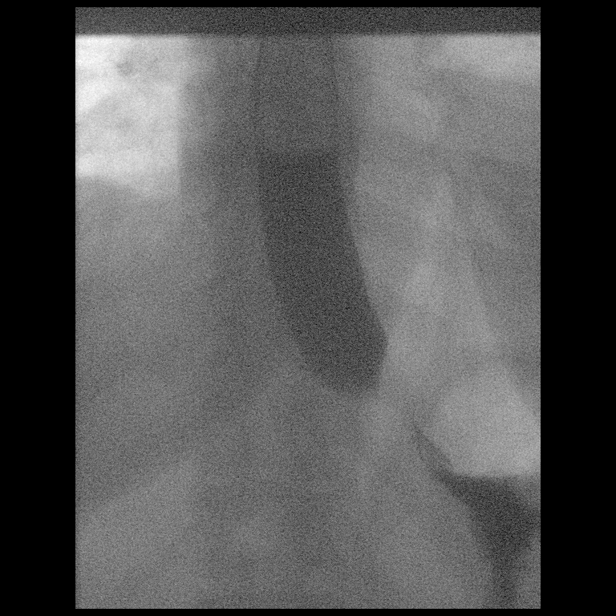

[Series 8: cp_standard · 1 of 113 frames shown (3 of 9)]
[frame 17/113]
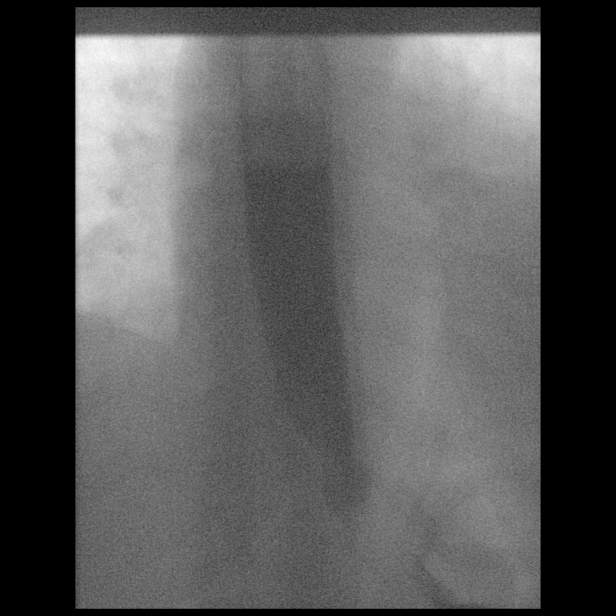

[Series 9: cp_standard · 1 of 223 frames shown (4 of 9)]
[frame 171/223]
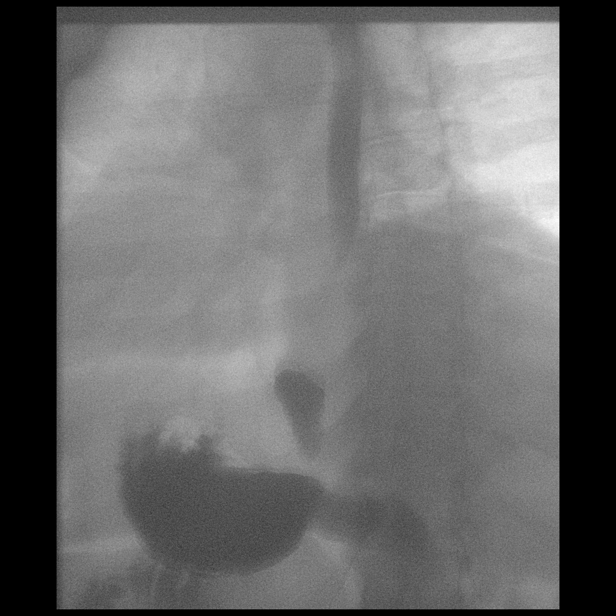

[Series 10: fluoro_barium 2fps_bw · 1 of 1 slices shown (1 of 4)]
[im 1/1]
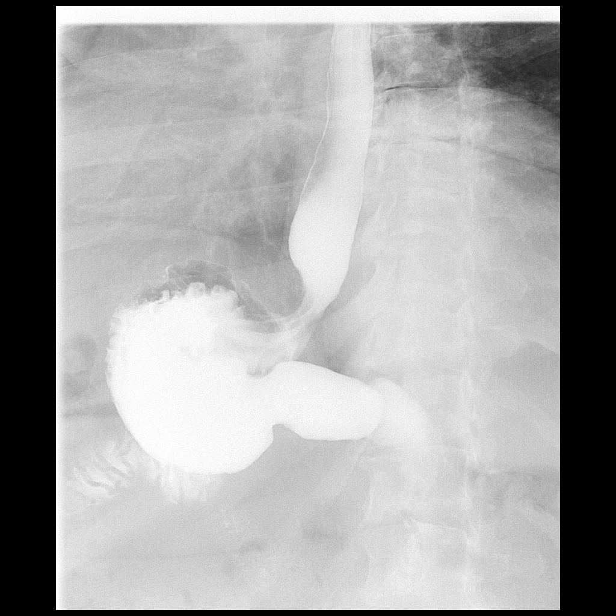

[Series 12: cp_standard · 1 of 38 frames shown (5 of 9)]
[frame 17/38]
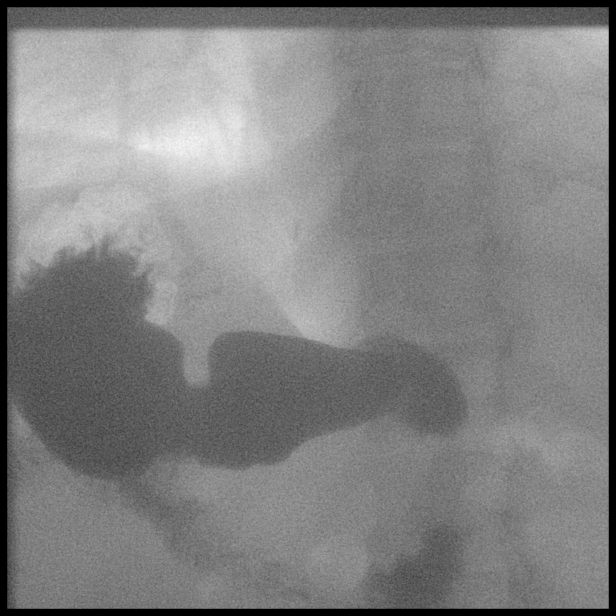

[Series 14: fluoro_barium 2fps_bw · 1 of 2 frames shown (2 of 4)]
[frame 2/2]
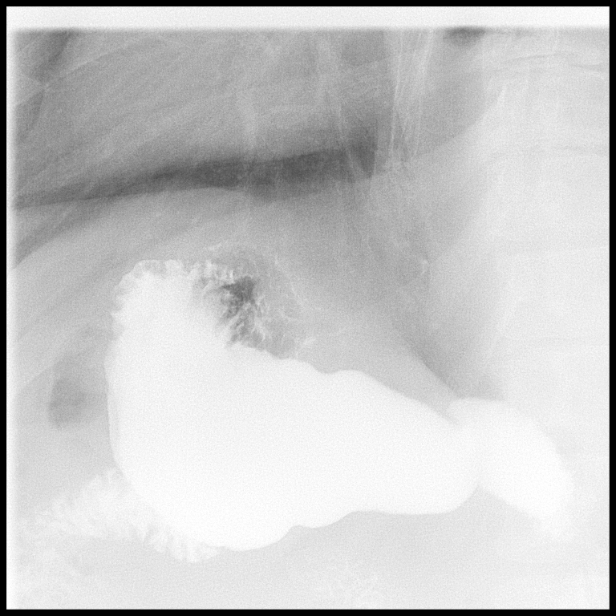

[Series 15: cp_standard · 1 of 87 frames shown (6 of 9)]
[frame 44/87]
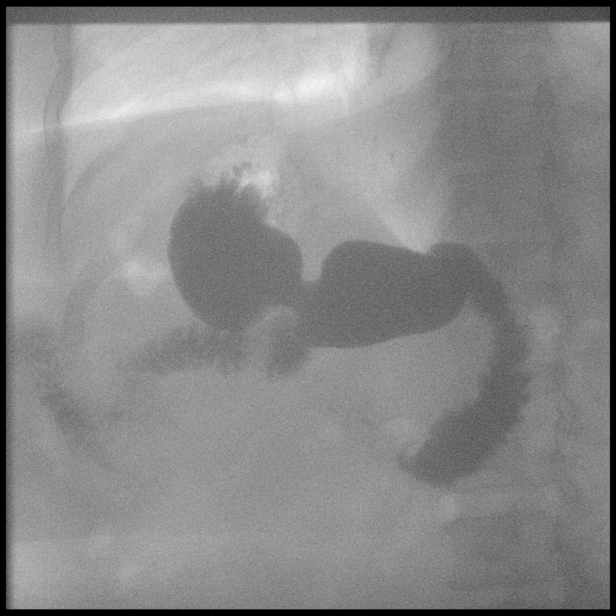

[Series 17: fluoro_barium 2fps_bw · 1 of 1 slices shown (3 of 4)]
[im 1/1]
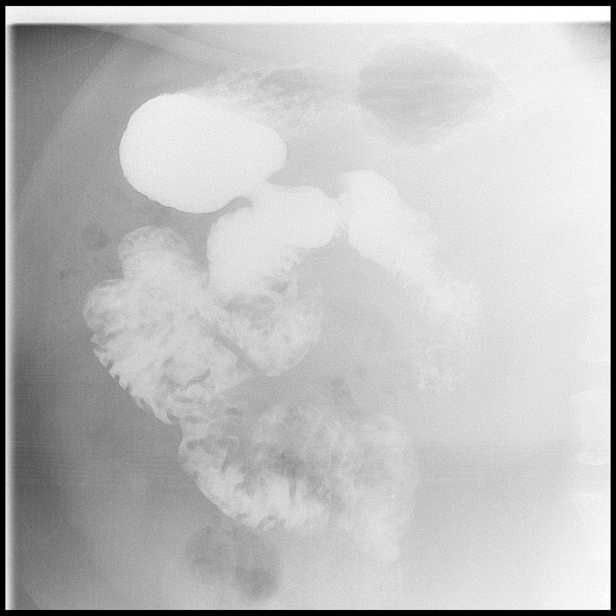

[Series 19: fluoro_barium 2fps_bw · 1 of 1 slices shown (4 of 4)]
[im 1/1]
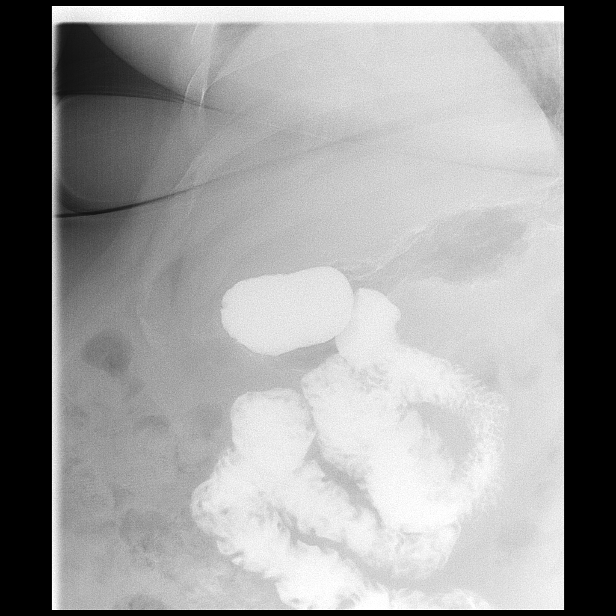

[Series 22: cp_standard · 1 of 76 frames shown (7 of 9)]
[frame 12/76]
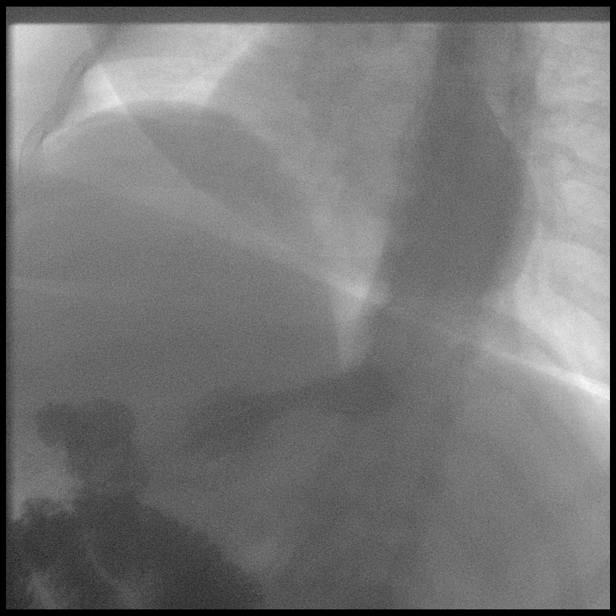

[Series 23: cp_standard · 2 of 158 frames shown (8 of 9)]
[frame 8/158]
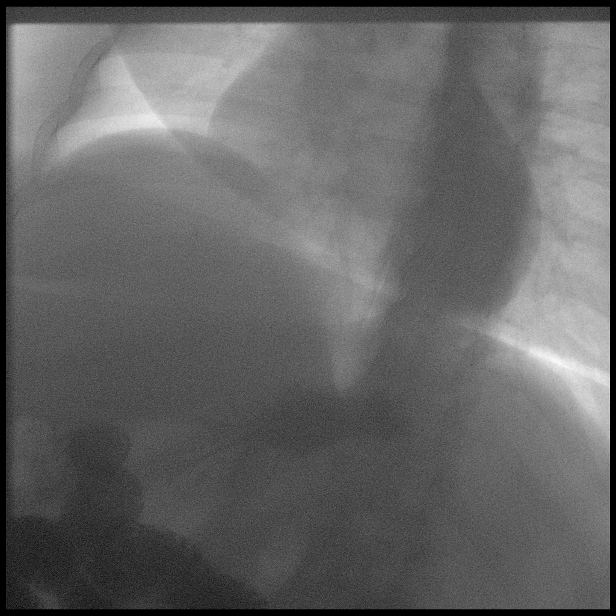
[frame 135/158]
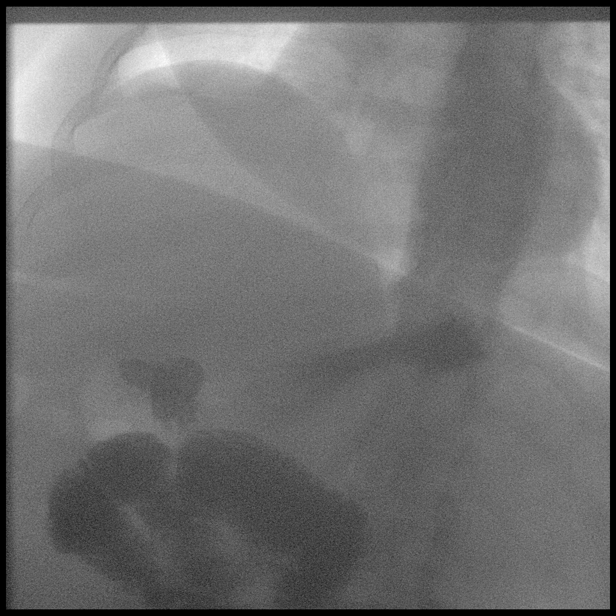

[Series 24: cp_standard · 1 of 152 frames shown (9 of 9)]
[frame 130/152]
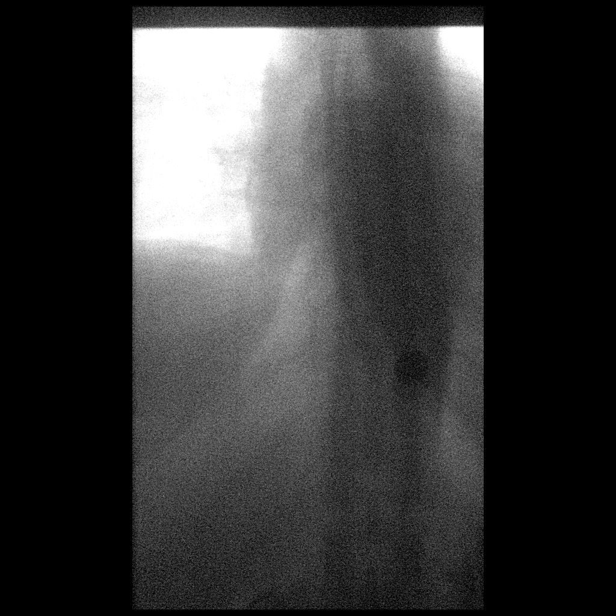

[15 of 24 positions shown; findings below may reference images not displayed]

FINDINGS: Scout Radiograph: Unremarkable bowel gas pattern. No significant
abnormal calcifications.

Esophagus:  Normal appearance.

Esophageal motility:  Within normal limits.

Gastroesophageal reflux:  None visualized.

Ingested 13mm barium tablet:  Not given

Stomach: Normal appearance. No hiatal hernia.  No malrotation.

Gastric emptying: Normal.

Duodenum:  Normal appearance.

Other:  None.
IMPRESSION: 1. Normal upper GI examination.

## 2023-09-03 ENCOUNTER — Other Ambulatory Visit (HOSPITAL_COMMUNITY): Payer: Self-pay

## 2023-09-03 MED ORDER — LISINOPRIL-HYDROCHLOROTHIAZIDE 20-12.5 MG PO TABS
1.0000 | ORAL_TABLET | Freq: Every day | ORAL | 0 refills | Status: DC
  Filled 2023-09-03: qty 30, 30d supply, fill #0

## 2023-09-03 MED ORDER — BUPROPION HCL ER (XL) 150 MG PO TB24
450.0000 mg | ORAL_TABLET | Freq: Every day | ORAL | 1 refills | Status: DC
  Filled 2023-09-03: qty 90, 30d supply, fill #0

## 2023-09-03 MED ORDER — BUSPIRONE HCL 7.5 MG PO TABS
7.5000 mg | ORAL_TABLET | Freq: Two times a day (BID) | ORAL | 2 refills | Status: DC
  Filled 2023-09-03: qty 60, 30d supply, fill #0

## 2023-09-03 MED ORDER — POTASSIUM CHLORIDE ER 10 MEQ PO TBCR
10.0000 meq | EXTENDED_RELEASE_TABLET | Freq: Every day | ORAL | 1 refills | Status: DC
  Filled 2023-09-03: qty 30, 30d supply, fill #0
  Filled 2023-10-21: qty 30, 30d supply, fill #1
  Filled 2023-12-03: qty 30, 30d supply, fill #2
  Filled 2024-01-12: qty 30, 30d supply, fill #3

## 2023-09-03 MED ORDER — AMPHETAMINE-DEXTROAMPHET ER 20 MG PO CP24
20.0000 mg | ORAL_CAPSULE | Freq: Every morning | ORAL | 0 refills | Status: DC
  Filled 2023-09-03: qty 30, 30d supply, fill #0

## 2023-09-03 MED ORDER — TRAZODONE HCL 50 MG PO TABS
50.0000 mg | ORAL_TABLET | Freq: Every evening | ORAL | 0 refills | Status: DC | PRN
  Filled 2023-09-03: qty 60, 30d supply, fill #0

## 2023-09-04 ENCOUNTER — Other Ambulatory Visit (HOSPITAL_COMMUNITY): Payer: Self-pay

## 2023-09-06 ENCOUNTER — Other Ambulatory Visit (HOSPITAL_COMMUNITY): Payer: Self-pay

## 2023-09-10 ENCOUNTER — Encounter: Payer: No Typology Code available for payment source | Admitting: Nurse Practitioner

## 2023-09-23 ENCOUNTER — Other Ambulatory Visit (HOSPITAL_COMMUNITY): Payer: Self-pay

## 2023-09-23 ENCOUNTER — Telehealth (INDEPENDENT_AMBULATORY_CARE_PROVIDER_SITE_OTHER): Payer: No Typology Code available for payment source | Admitting: Nurse Practitioner

## 2023-09-23 VITALS — Ht 64.0 in | Wt 218.0 lb

## 2023-09-23 DIAGNOSIS — D509 Iron deficiency anemia, unspecified: Secondary | ICD-10-CM

## 2023-09-23 DIAGNOSIS — E66813 Obesity, class 3: Secondary | ICD-10-CM

## 2023-09-23 DIAGNOSIS — F419 Anxiety disorder, unspecified: Secondary | ICD-10-CM

## 2023-09-23 DIAGNOSIS — Z6841 Body Mass Index (BMI) 40.0 and over, adult: Secondary | ICD-10-CM

## 2023-09-23 DIAGNOSIS — F909 Attention-deficit hyperactivity disorder, unspecified type: Secondary | ICD-10-CM

## 2023-09-23 MED ORDER — AMPHETAMINE-DEXTROAMPHET ER 20 MG PO CP24
20.0000 mg | ORAL_CAPSULE | Freq: Every morning | ORAL | 0 refills | Status: DC
  Filled 2023-09-23 – 2023-10-05 (×6): qty 30, 30d supply, fill #0

## 2023-09-23 MED ORDER — BUPROPION HCL ER (XL) 300 MG PO TB24
300.0000 mg | ORAL_TABLET | Freq: Every morning | ORAL | 0 refills | Status: DC
Start: 2023-09-23 — End: 2023-11-05
  Filled 2023-09-23 – 2023-10-05 (×2): qty 30, 30d supply, fill #0

## 2023-09-23 MED ORDER — BUSPIRONE HCL 10 MG PO TABS
10.0000 mg | ORAL_TABLET | Freq: Two times a day (BID) | ORAL | 0 refills | Status: DC
  Filled 2023-09-23 – 2023-10-05 (×2): qty 60, 30d supply, fill #0

## 2023-09-23 MED ORDER — IRON (FERROUS SULFATE) 325 (65 FE) MG PO TABS
325.0000 mg | ORAL_TABLET | Freq: Every day | ORAL | 3 refills | Status: DC
Start: 2023-09-23 — End: 2024-09-26
  Filled 2023-09-23: qty 90, 90d supply, fill #0
  Filled 2023-10-05: qty 30, 30d supply, fill #0
  Filled 2023-11-05: qty 30, 30d supply, fill #1
  Filled 2023-12-03: qty 30, 30d supply, fill #2
  Filled 2024-01-12 (×2): qty 30, 30d supply, fill #3
  Filled 2024-02-17: qty 30, 30d supply, fill #4
  Filled 2024-03-14 (×2): qty 30, 30d supply, fill #5
  Filled 2024-04-13 – 2024-04-17 (×4): qty 30, 30d supply, fill #6
  Filled 2024-05-10 (×2): qty 30, 30d supply, fill #7
  Filled 2024-06-14 – 2024-06-21 (×2): qty 30, 30d supply, fill #8
  Filled 2024-07-17: qty 30, 30d supply, fill #9
  Filled 2024-08-23 (×2): qty 30, 30d supply, fill #10

## 2023-09-23 NOTE — Assessment & Plan Note (Signed)
Chronic Patient to continue Xanax 0.5 mg twice a day as needed, Wellbutrin 300 mg daily, BuSpar 20 mg every morning, and trazodone 50 to 100 mg at bedtime as needed.

## 2023-09-23 NOTE — Assessment & Plan Note (Signed)
Iron refill sent to pharmacy

## 2023-09-23 NOTE — Progress Notes (Signed)
Established Patient Office Visit  An audio/visual tele-health visit was completed today for this patient. I connected with  Morgan Romero on 09/23/23 utilizing audio/visual technology and verified that I am speaking with the correct person using two identifiers. The patient was located at their home, and I was located at the office of [facility] at [facility] during the encounter. I discussed the limitations of evaluation and management by telemedicine. The patient expressed understanding and agreed to proceed.    Subjective   Patient ID: Morgan Romero, female    DOB: [birth year]  Age: 47 y.o. MRN: [id number]  Chief Complaint  Patient presents with   Anxiety    Patient arrives today because she wanted to discuss whether or not I would manage her ADHD as well as her anxiety.  She is currently on Adderall 20 mg daily and reports that she is tolerating this medication well and feels that it is helping with her ability to focus and concentrate.  She is also on Wellbutrin 300 mg daily, buSpar 20 mg daily, and trazodone 50 to 100 mg daily at bedtime.  She will take Xanax 0.5 mg twice a day as needed for severe anxiety but reports this is very rare when she takes it.  Reports that currently her mood is stable.   She also had gastric sleeve performed back in July 2023 for treatment of her obesity.  She has lost weight, but reports that her weight has stalled.  She would like to discuss possibly starting GLP-1 agonist or GLP-1 agonist-GIP weekly injection to help further with weight loss.  She denies personal or family history of thyroid cancer, denies history of gallstones or pancreatitis.  Patient is also on daily iron tablet for treatment of anemia.  She needs a refill.    ROS: see HPI    Objective:     Ht 5\' 4"  (1.626 m)   Wt 218 lb (98.9 kg)   BMI 37.42 kg/m    Physical Exam Comprehensive physical exam not completed today as office visit was conducted  remotely.  Patient appears well over video.  Patient was alert and oriented, and appeared to have appropriate judgment.   No results found for any visits on 09/23/23.    The 10-year ASCVD risk score (Arnett DK, et al., 2019) is: 1.5%    Assessment & Plan:   Problem List Items Addressed This Visit       Other   Class 3 severe obesity with serious comorbidity and body mass index (BMI) of 50.0 to 59.9 in adult [facility])    Chronic Starting weight 336 pounds, starting BMI 57.67 Current weight 218 pounds, current BMI 37.42  Patient's weight loss has stalled.  She did undergo gastric sleeve about 15 months ago.  Tolerated this well.  Will discuss with my supervising physician tomorrow regarding appropriate use of GLP-1 agonist versus GLP 1 agonist-GIP inpatient with history of gastric sleeve.  May consider prescribing this after that conversation.      Attention deficit hyperactivity disorder (ADHD)    Chronic, stable Continue Adderall 20 mg daily      Anxiety    Chronic Patient to continue Xanax 0.5 mg twice a day as needed, Wellbutrin 300 mg daily, BuSpar 20 mg every morning, and trazodone 50 to 100 mg at bedtime as needed.      Iron deficiency anemia - Primary    Iron refill sent to pharmacy      Relevant Medications  Iron, Ferrous Sulfate, 325 (65 Fe) MG TABS    Return for As scheduled in 6 weeks, or sooner as needed.    Elenore Paddy, NP

## 2023-09-23 NOTE — Assessment & Plan Note (Signed)
Chronic Starting weight 336 pounds, starting BMI 57.67 Current weight 218 pounds, current BMI 37.42  Patient's weight loss has stalled.  She did undergo gastric sleeve about 15 months ago.  Tolerated this well.  Will discuss with my supervising physician tomorrow regarding appropriate use of GLP-1 agonist versus GLP 1 agonist-GIP inpatient with history of gastric sleeve.  May consider prescribing this after that conversation.

## 2023-09-23 NOTE — Assessment & Plan Note (Signed)
Chronic, stable Continue Adderall 20 mg daily

## 2023-09-24 ENCOUNTER — Telehealth: Payer: Self-pay

## 2023-09-24 ENCOUNTER — Encounter: Payer: Self-pay | Admitting: Nurse Practitioner

## 2023-09-24 ENCOUNTER — Telehealth: Payer: Self-pay | Admitting: Nurse Practitioner

## 2023-09-24 ENCOUNTER — Other Ambulatory Visit (HOSPITAL_COMMUNITY): Payer: Self-pay

## 2023-09-24 NOTE — Telephone Encounter (Signed)
Can we start prior authorization for [facility] for treatment of obesity for this patient?

## 2023-09-24 NOTE — Telephone Encounter (Signed)
PA request has been Submitted. New Encounter created for follow up. For additional info see Pharmacy Prior Auth telephone encounter from 09/24/23.

## 2023-09-24 NOTE — Telephone Encounter (Signed)
Pharmacy Patient Advocate Encounter   Received notification from Pt Calls Messages that prior authorization for Wegovy 0.25MG /0.5ML auto-injectors is required/requested.   Insurance verification completed.   The patient is insured through [facility] .   Per test claim: PA required; PA submitted to [facility] via CoverMyMeds Key/confirmation #/EOC [id number] Status is pending

## 2023-09-25 ENCOUNTER — Other Ambulatory Visit (HOSPITAL_COMMUNITY): Payer: Self-pay

## 2023-09-27 ENCOUNTER — Other Ambulatory Visit (HOSPITAL_COMMUNITY): Payer: Self-pay

## 2023-09-27 NOTE — Telephone Encounter (Signed)
Pharmacy Patient Advocate Encounter  Received notification from [facility] that Prior Authorization for [facility] 0.25MG /0.5ML auto-injectors has been DENIED.  Full denial letter will be uploaded to the media tab. See denial reason below.   PA #/Case ID/Reference #: [id number]   DENIAL REASON: The requested medication and/or diagnosis are not a covered benefit and excluded from coverage in accordance with the terms and conditions of your plan benefit. Therefore, the request has been administratively denied.

## 2023-09-28 ENCOUNTER — Other Ambulatory Visit: Payer: Self-pay

## 2023-10-01 NOTE — Telephone Encounter (Signed)
Mychart message is send to pt about denial  

## 2023-10-04 ENCOUNTER — Other Ambulatory Visit (HOSPITAL_COMMUNITY): Payer: Self-pay

## 2023-10-05 ENCOUNTER — Other Ambulatory Visit (HOSPITAL_COMMUNITY): Payer: Self-pay

## 2023-10-06 ENCOUNTER — Other Ambulatory Visit (HOSPITAL_COMMUNITY): Payer: Self-pay

## 2023-10-06 ENCOUNTER — Other Ambulatory Visit: Payer: Self-pay | Admitting: Nurse Practitioner

## 2023-10-06 ENCOUNTER — Other Ambulatory Visit: Payer: Self-pay

## 2023-10-07 ENCOUNTER — Other Ambulatory Visit (HOSPITAL_COMMUNITY): Payer: Self-pay

## 2023-10-07 MED ORDER — LISINOPRIL-HYDROCHLOROTHIAZIDE 20-12.5 MG PO TABS
1.0000 | ORAL_TABLET | Freq: Every day | ORAL | 1 refills | Status: DC
  Filled 2023-10-07 – 2023-10-21 (×2): qty 30, 30d supply, fill #0
  Filled 2023-12-03: qty 30, 30d supply, fill #1
  Filled 2024-01-12: qty 30, 30d supply, fill #2

## 2023-10-18 ENCOUNTER — Other Ambulatory Visit (HOSPITAL_COMMUNITY): Payer: Self-pay

## 2023-10-21 ENCOUNTER — Other Ambulatory Visit: Payer: Self-pay

## 2023-11-05 ENCOUNTER — Ambulatory Visit (INDEPENDENT_AMBULATORY_CARE_PROVIDER_SITE_OTHER): Payer: No Typology Code available for payment source | Admitting: Nurse Practitioner

## 2023-11-05 ENCOUNTER — Other Ambulatory Visit (HOSPITAL_COMMUNITY): Payer: Self-pay

## 2023-11-05 VITALS — BP 122/84 | HR 77 | Temp 97.8°F | Ht 64.0 in | Wt 226.4 lb

## 2023-11-05 DIAGNOSIS — Z6841 Body Mass Index (BMI) 40.0 and over, adult: Secondary | ICD-10-CM

## 2023-11-05 DIAGNOSIS — F909 Attention-deficit hyperactivity disorder, unspecified type: Secondary | ICD-10-CM | POA: Diagnosis not present

## 2023-11-05 DIAGNOSIS — Z1239 Encounter for other screening for malignant neoplasm of breast: Secondary | ICD-10-CM | POA: Diagnosis not present

## 2023-11-05 DIAGNOSIS — F411 Generalized anxiety disorder: Secondary | ICD-10-CM | POA: Diagnosis not present

## 2023-11-05 DIAGNOSIS — J329 Chronic sinusitis, unspecified: Secondary | ICD-10-CM

## 2023-11-05 DIAGNOSIS — R21 Rash and other nonspecific skin eruption: Secondary | ICD-10-CM | POA: Insufficient documentation

## 2023-11-05 DIAGNOSIS — Z1211 Encounter for screening for malignant neoplasm of colon: Secondary | ICD-10-CM | POA: Diagnosis not present

## 2023-11-05 DIAGNOSIS — Z23 Encounter for immunization: Secondary | ICD-10-CM | POA: Diagnosis not present

## 2023-11-05 DIAGNOSIS — Z0001 Encounter for general adult medical examination with abnormal findings: Secondary | ICD-10-CM | POA: Diagnosis not present

## 2023-11-05 DIAGNOSIS — E66813 Obesity, class 3: Secondary | ICD-10-CM

## 2023-11-05 LAB — COMPREHENSIVE METABOLIC PANEL
ALT: 17 U/L (ref 0–35)
AST: 19 U/L (ref 0–37)
Albumin: 4.1 g/dL (ref 3.5–5.2)
Alkaline Phosphatase: 59 U/L (ref 39–117)
BUN: 13 mg/dL (ref 6–23)
CO2: 33 meq/L — ABNORMAL HIGH (ref 19–32)
Calcium: 9.2 mg/dL (ref 8.4–10.5)
Chloride: 99 meq/L (ref 96–112)
Creatinine, Ser: 0.88 mg/dL (ref 0.40–1.20)
GFR: 78.55 mL/min (ref >60.00)
Glucose, Bld: 94 mg/dL (ref 70–99)
Potassium: 4 meq/L (ref 3.5–5.1)
Sodium: 137 meq/L (ref 135–145)
Total Bilirubin: 0.4 mg/dL (ref 0.2–1.2)
Total Protein: 7.2 g/dL (ref 6.0–8.3)

## 2023-11-05 LAB — LIPID PANEL
Cholesterol: 175 mg/dL (ref 0–200)
HDL: 47.6 mg/dL (ref >39.00)
LDL Cholesterol: 109 mg/dL — ABNORMAL HIGH (ref 0–99)
NonHDL: 127.14
Total CHOL/HDL Ratio: 4
Triglycerides: 92 mg/dL (ref 0.0–149.0)
VLDL: 18.4 mg/dL (ref 0.0–40.0)

## 2023-11-05 LAB — CBC
HCT: 43.5 % (ref 36.0–46.0)
Hemoglobin: 15.1 g/dL — ABNORMAL HIGH (ref 12.0–15.0)
MCHC: 34.7 g/dL (ref 30.0–36.0)
MCV: 90.9 fL (ref 78.0–100.0)
Platelets: 190 10*3/uL (ref 150.0–400.0)
RBC: 4.79 Mil/uL (ref 3.87–5.11)
RDW: 12.9 % (ref 11.5–15.5)
WBC: 5 10*3/uL (ref 4.0–10.5)

## 2023-11-05 LAB — HEMOGLOBIN A1C: Hgb A1c MFr Bld: 5.2 % (ref 4.6–6.5)

## 2023-11-05 LAB — TSH: TSH: 4.61 u[IU]/mL (ref 0.35–5.50)

## 2023-11-05 MED ORDER — TRIAMCINOLONE ACETONIDE 0.1 % EX CREA
1.0000 | TOPICAL_CREAM | Freq: Two times a day (BID) | CUTANEOUS | 0 refills | Status: DC
  Filled 2023-11-05: qty 30, 15d supply, fill #0

## 2023-11-05 MED ORDER — AZITHROMYCIN 250 MG PO TABS
ORAL_TABLET | ORAL | 0 refills | Status: AC
  Filled 2023-11-05: qty 6, 5d supply, fill #0

## 2023-11-05 MED ORDER — ALPRAZOLAM 0.5 MG PO TABS
0.5000 mg | ORAL_TABLET | Freq: Two times a day (BID) | ORAL | 0 refills | Status: DC | PRN
  Filled 2023-11-05: qty 60, 30d supply, fill #0

## 2023-11-05 MED ORDER — BUSPIRONE HCL 10 MG PO TABS
20.0000 mg | ORAL_TABLET | Freq: Every day | ORAL | 3 refills | Status: DC
  Filled 2023-11-05: qty 60, 30d supply, fill #0
  Filled 2023-12-03: qty 60, 30d supply, fill #1
  Filled 2024-01-12: qty 60, 30d supply, fill #2
  Filled 2024-02-17: qty 60, 30d supply, fill #3

## 2023-11-05 MED ORDER — BUPROPION HCL ER (XL) 300 MG PO TB24
300.0000 mg | ORAL_TABLET | Freq: Every morning | ORAL | 3 refills | Status: DC
  Filled 2023-11-05: qty 30, 30d supply, fill #0
  Filled 2023-12-03: qty 30, 30d supply, fill #1
  Filled 2024-02-17: qty 30, 30d supply, fill #2
  Filled 2024-03-14: qty 30, 30d supply, fill #3
  Filled 2024-05-10: qty 30, 30d supply, fill #4
  Filled 2024-06-14 – 2024-06-21 (×2): qty 30, 30d supply, fill #5
  Filled 2024-07-17: qty 30, 30d supply, fill #6
  Filled 2024-08-23: qty 30, 30d supply, fill #7
  Filled 2024-09-26: qty 30, 30d supply, fill #8

## 2023-11-05 MED ORDER — AMPHETAMINE-DEXTROAMPHET ER 20 MG PO CP24
20.0000 mg | ORAL_CAPSULE | Freq: Every morning | ORAL | 0 refills | Status: DC
  Filled 2023-11-05: qty 30, 30d supply, fill #0

## 2023-11-05 MED ORDER — ALPRAZOLAM 0.5 MG PO TABS: 0.5000 mg | ORAL_TABLET | Freq: Two times a day (BID) | ORAL | 0 refills | Status: DC | PRN

## 2023-11-05 NOTE — Assessment & Plan Note (Signed)
Acute Likely viral, however as we are entering the weekend will prescribe patient antibiotic that she can have on hand if symptoms persist or worsen over the weekend.  She was encouraged to avoid taking antibiotic unless she does not feel better or starts to get worse.  She reports understanding.

## 2023-11-05 NOTE — Assessment & Plan Note (Signed)
Discussed health maintenance.  Flu shot administered today, vis provided.  Will order screening mammogram, order Cologuard for colon cancer screening. Handout provided.

## 2023-11-05 NOTE — Addendum Note (Signed)
Addended by: Cathleen Fears, Darcell Yacoub P on: 11/05/2023 03:38 PM   Modules accepted: Orders

## 2023-11-05 NOTE — Assessment & Plan Note (Signed)
Recommend colon cancer screening, discussed potential screening modalities.  Patient denies first-degree relative or personal history of colon cancer, no known polyps, no current visible blood seen in stool, no personal history of inflammatory bowel disease.  Patient has elected to consider Cologuard, ordered today.

## 2023-11-05 NOTE — Assessment & Plan Note (Signed)
Chronic, stable, no suicidal ideation. Continue alprazolam 0.5 mg twice a day as needed, Wellbutrin 300 mg daily, BuSpar 20 mg daily, and trazodone 50 to 100 mg at bedtime as needed for insomnia.

## 2023-11-05 NOTE — Progress Notes (Signed)
Complete physical exam  Patient: Morgan Romero   DOB: Feb 07, 1976   47 y.o. Female  MRN: [id number]  Subjective:    Chief Complaint  Patient presents with   Annual Exam    Morgan Romero is a 47 y.o. female who presents today for a complete physical exam. She reports consuming a  high-protein diet  diet.  Exercise: has joined a gym and plans on starting there soon.  She generally feels well. She reports sleeping well. She does have additional problems to discuss today.   Congestion: Has been present for about 5 days, no loss of taste or smell.  Is now developing into a dry cough.  Has some nasal discharge as well. No fever.   Skin lesion: Located to the left lower abdomen.  Is itchy.  Has been present for a year.  Seems to have been consistent in size but is not healing.  Anxiety/ADHD: Continues on Xanax 0.5 mg twice a day as needed, reports refilling this and using this very seldomly.  Does need refill today.  Also on Adderall 20 mg once a day every morning, reports feeling benefit from current dose, no chest pain or palpitations.  Wellbutrin 300 mg daily, tolerates this well.  Also on BuSpar 20 mg daily, tolerating well.  No suicidal ideation.  Will take trazodone 50 to 100 mg at bedtime as needed as well for insomnia.  Due for colon cancer screening as well as breast cancer screening.  Due for flu shot.  Most recent fall risk assessment:    03/05/2023    4:10 PM  Fall Risk   Falls in the past year? 0  Number falls in past yr: 0  Injury with Fall? 0  Risk for fall due to : No Fall Risks  Follow up Falls evaluation completed     Most recent depression screenings:    03/05/2023    4:10 PM 07/24/2022    4:44 PM  PHQ 2/9 Scores  PHQ - 2 Score 0 2  PHQ- 9 Score  5    Vision:Within last year and Dental: No current dental problems and Receives regular dental care  Past Medical History:  Diagnosis Date   ADHD (attention deficit hyperactivity disorder)    Anemia     when younger   Anxiety    Back pain    Bipolar disorder (HCC)    Depression    GERD (gastroesophageal reflux disease)    Headache(784.0)    Hypertension    Obsessive-compulsive disorder    Seasonal allergies    Vitamin D deficiency    Past Surgical History:  Procedure Laterality Date   CARPAL TUNNEL RELEASE Right    LAPAROSCOPIC GASTRIC SLEEVE RESECTION N/A 06/22/2022   Procedure: LAPAROSCOPIC SLEEVE GASTRECTOMY;  Surgeon: Berna Bue, MD;  Location: [facility];  Service: General;  Laterality: N/A;   UPPER GI ENDOSCOPY N/A 06/22/2022   Procedure: UPPER GI ENDOSCOPY;  Surgeon: Berna Bue, MD;  Location: [facility];  Service: General;  Laterality: N/A;   Social History   Socioeconomic History   Marital status: Single    Spouse name: Not on file   Number of children: Not on file   Years of education: Not on file   Highest education level: Not on file  Occupational History   Not on file  Tobacco Use   Smoking status: Former    Current packs/day: 0.25    Average packs/day: 0.3 packs/day for 15.0 years (3.8 ttl pk-yrs)  Types: E-cigarettes, Cigarettes    Passive exposure: Never   Smokeless tobacco: Never  Vaping Use   Vaping status: Former  Substance and Sexual Activity   Alcohol use: Not Currently    Comment: 1-2 drinks/month   Drug use: No   Sexual activity: Not Currently  Other Topics Concern   Not on file  Social History Narrative   Not on file   Social Determinants of Health   Financial Resource Strain: Not on file  Food Insecurity: No Food Insecurity (09/19/2021)   Received from [facility], [facility]   Hunger Vital Sign    Worried About Running Out of Food in the Last Year: Never true    Ran Out of Food in the Last Year: Never true  Transportation Needs: Not on file  Physical Activity: Not on file  Stress: Not on file  Social Connections: Unknown (03/31/2022)   Received [facility], [facility]   Social Network    Social Network: Not  on file  Intimate Partner Violence: Unknown (03/02/2022)   Received from [facility], [facility]   HITS    Physically Hurt: Not on file    Insult or Talk Down To: Not on file    Threaten Physical Harm: Not on file    Scream or Curse: Not on file   Family History  Problem Relation Age of Onset   Anxiety disorder Mother    Anxiety disorder Sister    Suicidality Neg Hx    Allergies  Allergen Reactions   Gabapentin Swelling   Strawberry (Diagnostic) Hives      Patient Care Team: Elenore Paddy, NP as PCP - General (Nurse Practitioner)   Outpatient Medications Prior to Visit  Medication Sig   acetaminophen (TYLENOL) 500 MG tablet Take 1,000 mg by mouth every 6 (six) hours as needed for moderate pain or mild pain.   APPLE CIDER VINEGAR PO Take by mouth.   calcium carbonate (TUMS EX) 750 MG chewable tablet Chew 1 tablet by mouth daily as needed for heartburn.   Cholecalciferol 125 MCG (5000 UT) TABS Take 5,000 Units by mouth daily.   Iron, Ferrous Sulfate, 325 (65 Fe) MG TABS Take 1 tablet by mouth daily.   levonorgestrel (MIRENA) 20 MCG/DAY IUD 1 each by Intrauterine route once.   lisinopril-hydrochlorothiazide (ZESTORETIC) 20-12.5 MG tablet Take 1 tablet by mouth daily.   potassium chloride (KLOR-CON) 10 MEQ tablet Take 1 tablet (10 mEq total) by mouth daily.   traZODone (DESYREL) 50 MG tablet Take 1-2 tablets (50-100 mg total) by mouth at bedtime as needed for sleep.   [DISCONTINUED] ALPRAZolam (XANAX) 0.5 MG tablet Take 1 tablet (0.5 mg total) by mouth 2 (two) times daily as needed for anxiety.   [DISCONTINUED] amphetamine-dextroamphetamine (ADDERALL XR) 20 MG 24 hr capsule Take 1 capsule (20 mg total) by mouth in the morning.   [DISCONTINUED] buPROPion (WELLBUTRIN XL) 300 MG 24 hr tablet Take 1 tablet (300 mg total) by mouth in the morning.   [DISCONTINUED] busPIRone (BUSPAR) 10 MG tablet Take 1 tablet (10 mg total) by mouth 2 (two) times daily. (Patient taking differently:  Take 20 mg by mouth daily.)   [DISCONTINUED] triamcinolone ointment (KENALOG) 0.5 % Apply 1 Application topically 2 (two) times daily.   No facility-administered medications prior to visit.    Review of Systems  Constitutional:  Negative for fever.  HENT:  Positive for congestion and sore throat.   Eyes:  Negative for blurred vision and double vision.  Respiratory:  Positive for cough. Negative for sputum production and shortness of breath.   Cardiovascular:  Negative for chest pain and palpitations.  Gastrointestinal:  Negative for abdominal pain, blood in stool, nausea and vomiting.  Genitourinary:  Negative for dysuria and hematuria.  Skin:  Positive for rash. Negative for itching.  Neurological:  Negative for seizures and loss of consciousness.  Psychiatric/Behavioral:  Positive for depression. Negative for suicidal ideas.           Objective:     BP 122/84   Pulse 77   Temp 97.8 F (36.6 C) (Temporal)   Ht 5\' 4"  (1.626 m)   Wt 226 lb 6 oz (102.7 kg)   SpO2 95%   BMI 38.86 kg/m  BP Readings from Last 3 Encounters:  11/05/23 122/84  03/22/23 (!) 145/83  03/05/23 130/84   Wt Readings from Last 3 Encounters:  11/05/23 226 lb 6 oz (102.7 kg)  09/23/23 218 lb (98.9 kg)  06/29/23 218 lb 4.8 oz (99 kg)      Physical Exam Vitals reviewed. Exam conducted with a chaperone present.  Constitutional:      Appearance: Normal appearance.  HENT:     Head: Normocephalic and atraumatic.     Right Ear: Tympanic membrane, ear canal and external ear normal.     Left Ear: Tympanic membrane, ear canal and external ear normal.  Eyes:     General:        Right eye: No discharge.        Left eye: No discharge.     Extraocular Movements: Extraocular movements intact.     Conjunctiva/sclera: Conjunctivae normal.     Pupils: Pupils are equal, round, and reactive to light.  Neck:     Vascular: No carotid bruit.  Cardiovascular:     Rate and Rhythm: Normal rate and regular  rhythm.     Pulses: Normal pulses.     Heart sounds: Normal heart sounds. No murmur heard. Pulmonary:     Effort: Pulmonary effort is normal.     Breath sounds: Normal breath sounds.  Chest:  Breasts:    Breasts are symmetrical.     Right: Normal.     Left: Normal.  Abdominal:     General: Abdomen is flat. Bowel sounds are normal. There is no distension.     Palpations: Abdomen is soft. There is no mass.     Tenderness: There is no abdominal tenderness.    Musculoskeletal:        General: No tenderness.     Cervical back: Neck supple. No muscular tenderness.     Right lower leg: No edema.     Left lower leg: No edema.  Lymphadenopathy:     Cervical: No cervical adenopathy.     Upper Body:     Right upper body: No supraclavicular adenopathy.     Left upper body: No supraclavicular adenopathy.  Skin:    General: Skin is warm and dry.  Neurological:     General: No focal deficit present.     Mental Status: She is alert and oriented to person, place, and time.     Motor: No weakness.     Gait: Gait normal.  Psychiatric:        Mood and Affect: Mood normal.        Behavior: Behavior normal.        Judgment: Judgment normal.      No results found for any visits on 11/05/23.  Assessment & Plan:    Routine Health Maintenance and Physical Exam   There is no immunization history on file for this patient.  Health Maintenance  Topic Date Due   DTaP/Tdap/Td (1 - Tdap) Never done   Colonoscopy  Never done   INFLUENZA VACCINE  Never done   COVID-19 Vaccine (1 - 2023-24 season) Never done   Cervical Cancer Screening (HPV/Pap Cotest)  07/14/2027   Hepatitis C Screening  Completed   HIV Screening  Completed   HPV VACCINES  Aged Out    Discussed health benefits of physical activity, and encouraged her to engage in regular exercise appropriate for her age and condition.  Problem List Items Addressed This Visit       Respiratory   Sinusitis    Acute Likely viral,  however as we are entering the weekend will prescribe patient antibiotic that she can have on hand if symptoms persist or worsen over the weekend.  She was encouraged to avoid taking antibiotic unless she does not feel better or starts to get worse.  She reports understanding.      Relevant Medications   azithromycin (ZITHROMAX) 250 MG tablet   Other Relevant Orders   CBC   Comprehensive metabolic panel   Hemoglobin A1c   Lipid panel   TSH     Musculoskeletal and Integument   Rash    Etiology unclear, trial topical triamcinolone cream If lesion does not improve or itching does not improve refer to dermatology.      Relevant Medications   triamcinolone cream (KENALOG) 0.1 %   Other Relevant Orders   CBC   Comprehensive metabolic panel   Hemoglobin A1c   Lipid panel   TSH     Other   GAD (generalized anxiety disorder)    Chronic, stable, no suicidal ideation. Continue alprazolam 0.5 mg twice a day as needed, Wellbutrin 300 mg daily, BuSpar 20 mg daily, and trazodone 50 to 100 mg at bedtime as needed for insomnia.      Relevant Medications   buPROPion (WELLBUTRIN XL) 300 MG 24 hr tablet   busPIRone (BUSPAR) 10 MG tablet   ALPRAZolam (XANAX) 0.5 MG tablet   Other Relevant Orders   CBC   Comprehensive metabolic panel   Hemoglobin A1c   Lipid panel   TSH   Class 3 severe obesity with serious comorbidity and body mass index (BMI) of 50.0 to 59.9 in adult [facility])    Chronic Labs were for further evaluation, further conditions may be made based on his results.      Relevant Medications   amphetamine-dextroamphetamine (ADDERALL XR) 20 MG 24 hr capsule   Other Relevant Orders   CBC   Comprehensive metabolic panel   Hemoglobin A1c   Lipid panel   TSH   Attention deficit hyperactivity disorder (ADHD)    Chronic, Stable Continue Adderall 20 mg daily, prescription sent to pharmacy. [place] controlled substance database reviewed.      Relevant Medications    amphetamine-dextroamphetamine (ADDERALL XR) 20 MG 24 hr capsule   Other Relevant Orders   CBC   Comprehensive metabolic panel   Hemoglobin A1c   Lipid panel   TSH   Colon cancer screening    Recommend colon cancer screening, discussed potential screening modalities.  Patient denies first-degree relative or personal history of colon cancer, no known polyps, no current visible blood seen in stool, no personal history of inflammatory bowel disease.  Patient has elected to consider Cologuard, ordered today.  Relevant Orders   Cologuard   CBC   Comprehensive metabolic panel   Hemoglobin A1c   Lipid panel   TSH   Encounter for screening for malignant neoplasm of breast    Bilateral screening mammogram ordered today.      Relevant Orders   MM 3D SCREENING MAMMOGRAM BILATERAL BREAST   CBC   Comprehensive metabolic panel   Hemoglobin A1c   Lipid panel   TSH   Encounter for general adult medical examination with abnormal findings - Primary    Discussed health maintenance.  Flu shot administered today, vis provided.  Will order screening mammogram, order Cologuard for colon cancer screening. Handout provided.       Relevant Orders   CBC   Comprehensive metabolic panel   Hemoglobin A1c   Lipid panel   TSH   Return in about 3 months (around 02/03/2024) for F/U with Calandra Madura.  In addition to performing annual physical exam also performed an office visit as detailed above.     Elenore Paddy, NP

## 2023-11-05 NOTE — Assessment & Plan Note (Signed)
Chronic, Stable Continue Adderall 20 mg daily, prescription sent to pharmacy. [place] controlled substance database reviewed.

## 2023-11-05 NOTE — Assessment & Plan Note (Signed)
Bilateral screening mammogram ordered today. ?

## 2023-11-05 NOTE — Assessment & Plan Note (Signed)
Chronic Labs were for further evaluation, further conditions may be made based on his results.

## 2023-11-05 NOTE — Assessment & Plan Note (Signed)
Etiology unclear, trial topical triamcinolone cream If lesion does not improve or itching does not improve refer to dermatology.

## 2023-11-10 ENCOUNTER — Encounter: Payer: Self-pay | Admitting: Nurse Practitioner

## 2023-12-03 ENCOUNTER — Other Ambulatory Visit: Payer: Self-pay

## 2023-12-03 ENCOUNTER — Other Ambulatory Visit: Payer: Self-pay | Admitting: Nurse Practitioner

## 2023-12-03 ENCOUNTER — Other Ambulatory Visit (HOSPITAL_COMMUNITY): Payer: Self-pay

## 2023-12-03 ENCOUNTER — Encounter: Payer: Self-pay | Admitting: Nurse Practitioner

## 2023-12-03 DIAGNOSIS — M79671 Pain in right foot: Secondary | ICD-10-CM

## 2023-12-03 DIAGNOSIS — F909 Attention-deficit hyperactivity disorder, unspecified type: Secondary | ICD-10-CM

## 2023-12-03 MED ORDER — AMPHETAMINE-DEXTROAMPHET ER 20 MG PO CP24
20.0000 mg | ORAL_CAPSULE | Freq: Every morning | ORAL | 0 refills | Status: DC
  Filled 2023-12-06: qty 30, 30d supply, fill #0

## 2023-12-06 ENCOUNTER — Other Ambulatory Visit: Payer: Self-pay

## 2023-12-06 ENCOUNTER — Other Ambulatory Visit (HOSPITAL_COMMUNITY): Payer: Self-pay

## 2023-12-13 ENCOUNTER — Ambulatory Visit: Payer: No Typology Code available for payment source

## 2023-12-13 ENCOUNTER — Encounter: Payer: Self-pay | Admitting: Podiatry

## 2023-12-13 ENCOUNTER — Ambulatory Visit (INDEPENDENT_AMBULATORY_CARE_PROVIDER_SITE_OTHER): Payer: No Typology Code available for payment source | Admitting: Podiatry

## 2023-12-13 DIAGNOSIS — I7300 Raynaud's syndrome without gangrene: Secondary | ICD-10-CM

## 2023-12-13 DIAGNOSIS — M79672 Pain in left foot: Secondary | ICD-10-CM

## 2023-12-13 DIAGNOSIS — M79671 Pain in right foot: Secondary | ICD-10-CM

## 2023-12-13 NOTE — Progress Notes (Signed)
  Subjective:  Patient ID: Morgan Romero, female    DOB: [birth year],   MRN: [id number]  No chief complaint on file.   48 y.o. female presents for concern of pain in both feet that has been ongoing for about a year.   . Denies any other pedal complaints. Denies n/v/f/c.   Past Medical History:  Diagnosis Date   ADHD (attention deficit hyperactivity disorder)    Anemia    when younger   Anxiety    Back pain    Bipolar disorder (HCC)    Depression    GERD (gastroesophageal reflux disease)    Headache(784.0)    Hypertension    Obsessive-compulsive disorder    Seasonal allergies    Vitamin D deficiency     Objective:  Physical Exam: Vascular: DP/PT pulses 2/4 bilateral. CFT <3 seconds. Normal hair growth on digits. No edema. Discolorations of toes 1-5 bilateral. While examining toes white and turn to purple.  Skin. No lacerations or abrasions bilateral feet.  Musculoskeletal: MMT 5/5 bilateral lower extremities in DF, PF, Inversion and Eversion. Deceased ROM in DF of ankle joint. No pain to palpation about the foot.  Neurological: Sensation intact to light touch.   Assessment:   1. Foot pain, bilateral   2. Raynaud's disease without gangrene      Plan:  Patient was evaluated and treated and all questions answered. Discussed diangosis and treatments options with patient.  X-rays reviewed. No acute fractures or dislocatiosn. Pes cavus noted.  Discussed Raynauds and etiologies.  Discussed ABIs to check vascular statu.  Recommend referral to vascular for treatment options,.  Discussed keeping feet warm as much as possible.  Arthritis panel ordered to rule out and systemic arthritis. Does relates some joint aches and pains elsewhere. Dicussed potential referral to rheumatology.  Return as needed  No follow-ups on file.   Asberry Failing, DPM

## 2023-12-14 ENCOUNTER — Other Ambulatory Visit (HOSPITAL_COMMUNITY): Payer: Self-pay | Admitting: Podiatry

## 2023-12-14 DIAGNOSIS — I739 Peripheral vascular disease, unspecified: Secondary | ICD-10-CM

## 2023-12-15 ENCOUNTER — Ambulatory Visit (HOSPITAL_COMMUNITY)
Admission: RE | Admit: 2023-12-15 | Discharge: 2023-12-15 | Disposition: A | Discharge status: Home / Self Care | Payer: No Typology Code available for payment source | Source: Ambulatory Visit | Attending: Vascular Surgery | Admitting: Vascular Surgery

## 2023-12-15 DIAGNOSIS — I739 Peripheral vascular disease, unspecified: Secondary | ICD-10-CM | POA: Diagnosis present

## 2023-12-15 LAB — VAS US ABI WITH/WO TBI
Left ABI: 1.88
Right ABI: 1.22

## 2023-12-16 ENCOUNTER — Ambulatory Visit: Payer: No Typology Code available for payment source | Admitting: Vascular Surgery

## 2023-12-16 ENCOUNTER — Encounter: Payer: Self-pay | Admitting: Vascular Surgery

## 2023-12-16 VITALS — BP 137/82 | HR 68 | Temp 98.0°F | Resp 20 | Ht 64.0 in | Wt 223.0 lb

## 2023-12-16 DIAGNOSIS — I7300 Raynaud's syndrome without gangrene: Secondary | ICD-10-CM | POA: Diagnosis not present

## 2023-12-16 NOTE — Progress Notes (Signed)
Office Note     CC: Raynaud's Requesting Provider:  Elenore Paddy, NP  HPI: Morgan Romero is a 48 y.o. (Apr 15, 1976) female presenting at the request of .Elenore Paddy, NP for evaluation of Raynaud's phenomenon.  On exam, Morgan Romero was doing well.  A native of [place], she moved to [place] years ago.  She works full-time for [employer], and is able to work from home.  Morgan Romero notes living a relatively sedentary lifestyle.  She recently joined a gym, and is working on increasing her activity level.    Morgan Romero has appreciated discoloration in her hands and feet for years.  She notes coloration changes including white-blue and red.  Her feet are always cold, and she tries to wear multiple socks to prevent the pain and sometimes numbness from occurring.  She has significant pain when exposed to the cold, and notes pain when rewarming her feet.  When warm, her toes are red or normal, however when cold, they become mottled or blanched.  She denies history of wound healing difficulties.  Denies ulceration on the feet or hands. No history of autoimmune disease  Current medications include adderall Drinks roughly 32 to 40 ounces of coffee daily.   Past Medical History:  Diagnosis Date   ADHD (attention deficit hyperactivity disorder)    Anemia    when younger   Anxiety    Back pain    Bipolar disorder (HCC)    Depression    GERD (gastroesophageal reflux disease)    Headache(784.0)    Hypertension    Obsessive-compulsive disorder    Seasonal allergies    Vitamin D deficiency     Past Surgical History:  Procedure Laterality Date   CARPAL TUNNEL RELEASE Right    LAPAROSCOPIC GASTRIC SLEEVE RESECTION N/A 06/22/2022   Procedure: LAPAROSCOPIC SLEEVE GASTRECTOMY;  Surgeon: Berna Bue, MD;  Location: [facility];  Service: General;  Laterality: N/A;   UPPER GI ENDOSCOPY N/A 06/22/2022   Procedure: UPPER GI ENDOSCOPY;  Surgeon: Berna Bue, MD;  Location: [facility];  Service: General;   Laterality: N/A;    Social History   Socioeconomic History   Marital status: Single    Spouse name: Not on file   Number of children: Not on file   Years of education: Not on file   Highest education level: Not on file  Occupational History   Not on file  Tobacco Use   Smoking status: Former    Current packs/day: 0.25    Average packs/day: 0.3 packs/day for 15.0 years (3.8 ttl pk-yrs)    Types: E-cigarettes, Cigarettes    Passive exposure: Never   Smokeless tobacco: Never  Vaping Use   Vaping status: Former  Substance and Sexual Activity   Alcohol use: Not Currently    Comment: 1-2 drinks/month   Drug use: No   Sexual activity: Not Currently  Other Topics Concern   Not on file  Social History Narrative   Not on file   Social Drivers of Health   Financial Resource Strain: Not on file  Food Insecurity: No Food Insecurity (09/19/2021)   Received from [facility], [facility]   Hunger Vital Sign    Worried About Running Out of Food in the Last Year: Never true    Ran Out of Food in the Last Year: Never true  Transportation Needs: Not on file  Physical Activity: Not on file  Stress: Not on file  Social Connections: Unknown (03/31/2022)   [facility] from [facility]   Social Network    Social Network: Not on file  Intimate Partner Violence: Unknown (03/02/2022)   Received from [facility], [facility]   HITS    Physically Hurt: Not on file    Insult or Talk Down To: Not on file    Threaten Physical Harm: Not on file    Scream or Curse: Not on file   Family History  Problem Relation Age of Onset   Anxiety disorder Mother    Anxiety disorder Sister    Suicidality Neg Hx     Current Outpatient Medications  Medication Sig Dispense Refill   acetaminophen (TYLENOL) 500 MG tablet Take 1,000 mg by mouth every 6 (six) hours as needed for moderate pain or mild pain.     ALPRAZolam (XANAX) 0.5 MG tablet Take 1 tablet (0.5 mg total) by mouth 2 (two)  times daily as needed for anxiety. 60 tablet 0   amphetamine-dextroamphetamine (ADDERALL XR) 20 MG 24 hr capsule Take 1 capsule (20 mg total) by mouth in the morning. 30 capsule 0   APPLE CIDER VINEGAR PO Take by mouth.     buPROPion (WELLBUTRIN XL) 300 MG 24 hr tablet Take 1 tablet (300 mg total) by mouth in the morning. 90 tablet 3   busPIRone (BUSPAR) 10 MG tablet Take 2 tablets (20 mg total) by mouth daily. 90 tablet 3   calcium carbonate (TUMS EX) 750 MG chewable tablet Chew 1 tablet by mouth daily as needed for heartburn.     Cholecalciferol 125 MCG (5000 UT) TABS Take 5,000 Units by mouth daily.     Iron, Ferrous Sulfate, 325 (65 Fe) MG TABS Take 1 tablet by mouth daily. 90 tablet 3   levonorgestrel (MIRENA) 20 MCG/DAY IUD 1 each by Intrauterine route once.     lisinopril-hydrochlorothiazide (ZESTORETIC) 20-12.5 MG tablet Take 1 tablet by mouth daily. 90 tablet 1   potassium chloride (KLOR-CON) 10 MEQ tablet Take 1 tablet (10 mEq total) by mouth daily. 90 tablet 1   traZODone (DESYREL) 50 MG tablet Take 1-2 tablets (50-100 mg total) by mouth at bedtime as needed for sleep. 60 tablet 0   triamcinolone cream (KENALOG) 0.1 % Apply 1 Application topically 2 (two) times daily. 30 g 0   No current facility-administered medications for this visit.    Allergies  Allergen Reactions   Gabapentin Swelling   Strawberry (Diagnostic) Hives     REVIEW OF SYSTEMS:  [X]  denotes positive finding, [ ]  denotes negative finding Cardiac  Comments:  Chest pain or chest pressure:    Shortness of breath upon exertion:    Short of breath when lying flat:    Irregular heart rhythm:        Vascular    Pain in calf, thigh, or hip brought on by ambulation:    Pain in feet at night that wakes you up from your sleep:     Blood clot in your veins:    Leg swelling:         Pulmonary    Oxygen at home:    Productive cough:     Wheezing:         Neurologic    Sudden weakness in arms or legs:      Sudden numbness in arms or legs:     Sudden onset of difficulty speaking or slurred speech:    Temporary loss of vision in one eye:     Problems with dizziness:  Gastrointestinal    Blood in stool:     Vomited blood:         Genitourinary    Burning when urinating:     Blood in urine:        Psychiatric    Major depression:         Hematologic    Bleeding problems:    Problems with blood clotting too easily:        Skin    Rashes or ulcers:        Constitutional    Fever or chills:      PHYSICAL EXAMINATION:  Vitals:   12/16/23 1122  BP: 137/82  Pulse: 68  Resp: 20  Temp: 98 F (36.7 C)  SpO2: 98%  Weight: 223 lb (101.2 kg)  Height: 5\' 4"  (1.626 m)    General:  WDWN in NAD; vital signs documented above Gait: Not observed HENT: WNL, normocephalic Pulmonary: normal non-labored breathing , without wheezing Cardiac: regular HR Abdomen: soft, NT, no masses Skin: without rashes Vascular Exam/Pulses:  Right Left  Radial 2+ (normal) 2+ (normal)  Ulnar    Femoral    Popliteal    DP 2+ (normal) 2+ (normal)  PT     Extremities: without ischemic changes, without Gangrene , without cellulitis; without open wounds;  Significant cyanosis appreciated in all of the toes bilaterally.  Blanching with palpation, sluggish cap refill. Musculoskeletal: no muscle wasting or atrophy  Neurologic: A&O X 3;  No focal weakness or paresthesias are detected Psychiatric:  The pt has Normal affect.   Non-Invasive Vascular Imaging:    ABI Findings:  +---------+------------------+-----+---------+----------------+  Right   Rt Pressure (mmHg)IndexWaveform Comment           +---------+------------------+-----+---------+----------------+  Brachial 129                                               +---------+------------------+-----+---------+----------------+  PTA     150               1.16 triphasic                   +---------+------------------+-----+---------+----------------+  DP      158               1.22 triphasic                  +---------+------------------+-----+---------+----------------+  Great Toe                                Unable to obtain  +---------+------------------+-----+---------+----------------+   +--------+------------------+-----+---------+-----------------+  Left   Lt Pressure (mmHg)IndexWaveform Comment            +--------+------------------+-----+---------+-----------------+  [id number]                                               +--------+------------------+-----+---------+-----------------+  PTA    152               1.18 triphasic                   +--------+------------------+-----+---------+-----------------+  DP     137  1.06 triphasic                   +--------+------------------+-----+---------+-----------------+                                         Unable to obtain.  +--------+------------------+-----+---------+-----------------+   +-------+-----------+----------------+------------+------------+  ABI/TBIToday's ABIToday's TBI     Previous ABIPrevious TBI  +-------+-----------+----------------+------------+------------+  Right 1.22       Unable to obtain                          +-------+-----------+----------------+------------+------------+  Left  1.88       Unable to obtain                          +-------+-----------+----------------+------------+------------+      ASSESSMENT/PLAN: Eleanore L Budnick is a 48 y.o. female presenting with symptoms consistent with Raynaud's.  She denied symptoms consistent with many of the auto immune diseases associated with secondary Raynaud's, and fortunately has no history of ulceration at the area of vasospasm.  On physical exam, she had palpable pulses in the feet.  ABIs were normal, with no toe pressure appreciated.  Morgan Romero and I  discussed that while there are medications that can help with symptoms, there are also certain lifestyle changes and medication changes that can provide significant benefit.  These include keeping the feet as warm as possible and limiting caffeine/ergot intake which cause vasospasm. From a medication standpoint, Adderall has been found to cause Raynaud's, and this has been a chronic medication for her.  She is unsure if she would be able to function without it, which is understandable.  After lifestyle changes, there are several treatments for Raynaud's. Nifedipine, which is a calcium channel blocker, which can increase vasodilation in the fingers and toes. This can be prescribed in an extended release tablet. (30-120mg  daily) Should nifedipine not work, amlodipine has also proven to have some efficacy (5-20mg  daily) Phosphodiesterase 5 inhibitors are second line therapy - Sildenafil, tadalafil, or vardenafil. From a topical standpoint, nitric oxide cream can be used.  I discussed with Morgan Romero that I would share treatment options with Norva Riffle NP so they could have a discussion regarding the best course of action, including possible medication changes. At this point. Morgan Romero can follow up with me as needed as her macrovasculature is normal.   Victorino Sparrow, MD [facility] [phone] Total time of patient care including pre-visit research, consultation, and documentation greater than 30 minutes

## 2023-12-18 LAB — COLOGUARD: COLOGUARD: NEGATIVE

## 2023-12-20 ENCOUNTER — Encounter: Payer: Self-pay | Admitting: Nurse Practitioner

## 2023-12-20 ENCOUNTER — Encounter: Payer: Self-pay | Admitting: Vascular Surgery

## 2023-12-21 ENCOUNTER — Telehealth: Payer: Self-pay | Admitting: Nurse Practitioner

## 2023-12-21 ENCOUNTER — Other Ambulatory Visit (HOSPITAL_COMMUNITY): Payer: Self-pay

## 2023-12-21 ENCOUNTER — Other Ambulatory Visit: Payer: Self-pay | Admitting: Nurse Practitioner

## 2023-12-21 DIAGNOSIS — I7300 Raynaud's syndrome without gangrene: Secondary | ICD-10-CM

## 2023-12-21 MED ORDER — NIFEDIPINE ER OSMOTIC RELEASE 30 MG PO TB24
30.0000 mg | ORAL_TABLET | Freq: Every day | ORAL | 2 refills | Status: DC
  Filled 2023-12-21: qty 30, 30d supply, fill #0
  Filled 2024-01-12: qty 30, 30d supply, fill #1

## 2023-12-21 NOTE — Telephone Encounter (Signed)
Please call patient and schedule her a follow-up with me in 2-4 weeks to see how she is tolerating a new medication that was prescribed to her by myself for Raynaud's. She should be aware that you will be calling to schedule an in-person appointment.

## 2023-12-23 LAB — LAB REPORT - SCANNED
Free T4: 1.06 ng/dL
TSH: 6.78 — AB (ref 0.41–5.90)

## 2023-12-24 LAB — ARTHRITIS PANEL
Anti Nuclear Antibody (ANA): NEGATIVE
Rheumatoid fact SerPl-aCnc: <10 [IU]/mL (ref <14.0)
Sed Rate: 10 mm/h (ref 0–32)
Uric Acid: 4.6 mg/dL (ref 2.6–6.2)

## 2023-12-29 NOTE — Telephone Encounter (Signed)
Schedule pt for follow up appointment

## 2024-01-12 ENCOUNTER — Ambulatory Visit: Payer: No Typology Code available for payment source | Admitting: Nurse Practitioner

## 2024-01-12 ENCOUNTER — Other Ambulatory Visit (HOSPITAL_COMMUNITY): Payer: Self-pay

## 2024-01-12 ENCOUNTER — Other Ambulatory Visit: Payer: Self-pay | Admitting: Nurse Practitioner

## 2024-01-12 DIAGNOSIS — I7300 Raynaud's syndrome without gangrene: Secondary | ICD-10-CM | POA: Diagnosis not present

## 2024-01-12 DIAGNOSIS — F909 Attention-deficit hyperactivity disorder, unspecified type: Secondary | ICD-10-CM

## 2024-01-12 MED ORDER — NIFEDIPINE ER 60 MG PO TB24
60.0000 mg | ORAL_TABLET | Freq: Every day | ORAL | 2 refills | Status: DC
  Filled 2024-01-12: qty 30, 30d supply, fill #0
  Filled 2024-02-17: qty 30, 30d supply, fill #1
  Filled 2024-03-14: qty 30, 30d supply, fill #2

## 2024-01-12 MED ORDER — CHOLECALCIFEROL 125 MCG (5000 UT) PO TABS
5000.0000 [IU] | ORAL_TABLET | Freq: Every day | ORAL | 3 refills | Status: AC
  Filled 2024-01-12: qty 100, 100d supply, fill #0

## 2024-01-12 MED ORDER — ACETAMINOPHEN 500 MG PO TABS
1000.0000 mg | ORAL_TABLET | Freq: Four times a day (QID) | ORAL | 3 refills | Status: DC | PRN
  Filled 2024-01-12: qty 30, 4d supply, fill #0

## 2024-01-12 MED ORDER — AMPHETAMINE-DEXTROAMPHET ER 20 MG PO CP24
20.0000 mg | ORAL_CAPSULE | Freq: Every morning | ORAL | 0 refills | Status: DC
  Filled 2024-01-12 (×2): qty 30, 30d supply, fill #0

## 2024-01-12 NOTE — Progress Notes (Signed)
   Established Patient Office Visit  Subjective   Patient ID: Morgan Romero, female    DOB: [birth year]  Age: 48 y.o. MRN: [id number]  Chief Complaint  Patient presents with   Raynaud's    Raynaud's: Patient started on nifedipine after consultation with vascular surgeon. Reports she is tolerating medication well. She has noticed some improvement in the pain in her toes     ROS: see HPI    Objective:     BP 110/72   Temp 98 F (36.7 C) (Temporal)   Ht 5\' 4"  (1.626 m)   Wt 224 lb 2 oz (101.7 kg)   BMI 38.47 kg/m  BP Readings from Last 3 Encounters:  01/12/24 110/72  12/16/23 137/82  11/05/23 122/84   Wt Readings from Last 3 Encounters:  01/12/24 224 lb 2 oz (101.7 kg)  12/16/23 223 lb (101.2 kg)  11/05/23 226 lb 6 oz (102.7 kg)      Physical Exam Vitals reviewed.  Constitutional:      General: She is not in acute distress.    Appearance: Normal appearance.  HENT:     Head: Normocephalic and atraumatic.  Neck:     Vascular: No carotid bruit.  Cardiovascular:     Rate and Rhythm: Normal rate and regular rhythm.     Pulses: Normal pulses.     Heart sounds: Normal heart sounds.  Pulmonary:     Effort: Pulmonary effort is normal.     Breath sounds: Normal breath sounds.  Skin:    General: Skin is warm and dry.  Neurological:     General: No focal deficit present.     Mental Status: She is alert and oriented to person, place, and time.  Psychiatric:        Mood and Affect: Mood normal.        Behavior: Behavior normal.        Judgment: Judgment normal.      No results found for any visits on 01/12/24.    The 10-year ASCVD risk score (Arnett DK, et al., 2019) is: 1%    Assessment & Plan:   Problem List Items Addressed This Visit       Cardiovascular and Mediastinum   Raynaud's disease without gangrene   Chronic Symptoms improved with initiation of nifedipine. Per shared decision making will increase dose of nifedipine to 60 mg a mouth  daily to see if we can get further improvement in symptoms.  Because blood pressure has decreased since starting the nifedipine we will stop lisinopril hydrochlorothiazide 20-12.5 mg tablet and patient will return to office in 1 month for close monitoring.  She was encouraged to reach out with any questions or concerns between now and next appointment.      Relevant Medications   NIFEdipine (ADALAT CC) 60 MG 24 hr tablet    Return in about 1 month (around 02/09/2024) for F/U with Maralyn Sago.    Elenore Paddy, NP

## 2024-01-12 NOTE — Assessment & Plan Note (Signed)
Chronic Symptoms improved with initiation of nifedipine. Per shared decision making will increase dose of nifedipine to 60 mg a mouth daily to see if we can get further improvement in symptoms.  Because blood pressure has decreased since starting the nifedipine we will stop lisinopril hydrochlorothiazide 20-12.5 mg tablet and patient will return to office in 1 month for close monitoring.  She was encouraged to reach out with any questions or concerns between now and next appointment.

## 2024-01-13 ENCOUNTER — Other Ambulatory Visit (HOSPITAL_COMMUNITY): Payer: Self-pay

## 2024-01-13 ENCOUNTER — Other Ambulatory Visit: Payer: Self-pay

## 2024-01-13 ENCOUNTER — Encounter: Payer: Self-pay | Admitting: Pharmacist

## 2024-01-14 ENCOUNTER — Encounter: Payer: Self-pay | Admitting: Nurse Practitioner

## 2024-01-19 ENCOUNTER — Other Ambulatory Visit (HOSPITAL_COMMUNITY): Payer: Self-pay

## 2024-01-19 MED ORDER — METFORMIN HCL ER 500 MG PO TB24
1000.0000 mg | ORAL_TABLET | Freq: Two times a day (BID) | ORAL | 3 refills | Status: DC
  Filled 2024-01-19: qty 120, 30d supply, fill #0
  Filled 2024-02-17: qty 120, 30d supply, fill #1
  Filled 2024-03-14: qty 120, 30d supply, fill #2
  Filled 2024-04-13 – 2024-04-17 (×2): qty 120, 30d supply, fill #3

## 2024-02-03 ENCOUNTER — Ambulatory Visit: Payer: No Typology Code available for payment source | Admitting: Nurse Practitioner

## 2024-02-16 ENCOUNTER — Other Ambulatory Visit: Payer: Self-pay

## 2024-02-16 ENCOUNTER — Ambulatory Visit
Admission: RE | Admit: 2024-02-16 | Discharge: 2024-02-16 | Disposition: A | Discharge status: Home / Self Care | Source: Ambulatory Visit | Attending: Nurse Practitioner | Admitting: Nurse Practitioner

## 2024-02-16 ENCOUNTER — Other Ambulatory Visit (HOSPITAL_COMMUNITY): Payer: Self-pay

## 2024-02-16 ENCOUNTER — Ambulatory Visit: Payer: No Typology Code available for payment source | Admitting: Nurse Practitioner

## 2024-02-16 VITALS — BP 126/82 | HR 67 | Temp 98.2°F | Ht 64.0 in | Wt 218.2 lb

## 2024-02-16 DIAGNOSIS — F411 Generalized anxiety disorder: Secondary | ICD-10-CM | POA: Diagnosis not present

## 2024-02-16 DIAGNOSIS — I7300 Raynaud's syndrome without gangrene: Secondary | ICD-10-CM

## 2024-02-16 DIAGNOSIS — I10 Essential (primary) hypertension: Secondary | ICD-10-CM

## 2024-02-16 DIAGNOSIS — F909 Attention-deficit hyperactivity disorder, unspecified type: Secondary | ICD-10-CM

## 2024-02-16 DIAGNOSIS — F331 Major depressive disorder, recurrent, moderate: Secondary | ICD-10-CM | POA: Diagnosis not present

## 2024-02-16 DIAGNOSIS — F419 Anxiety disorder, unspecified: Secondary | ICD-10-CM

## 2024-02-16 DIAGNOSIS — Z1239 Encounter for other screening for malignant neoplasm of breast: Secondary | ICD-10-CM

## 2024-02-16 MED ORDER — ALPRAZOLAM 0.5 MG PO TABS
0.5000 mg | ORAL_TABLET | Freq: Two times a day (BID) | ORAL | 0 refills | Status: DC | PRN
  Filled 2024-02-16: qty 60, 30d supply, fill #0

## 2024-02-16 MED ORDER — AMPHETAMINE-DEXTROAMPHET ER 20 MG PO CP24
20.0000 mg | ORAL_CAPSULE | Freq: Every morning | ORAL | 0 refills | Status: DC
  Filled 2024-02-16: qty 30, 30d supply, fill #0

## 2024-02-16 NOTE — Assessment & Plan Note (Signed)
 Chronic, recently worsened likely due to increase stressors regarding buying a house Consider Wellbutrin 20 mg daily, BuSpar 20 mg daily, alprazolam 0.5 mg twice daily.  Prescription refill of alprazolam to the pharmacy, [facility] controlled substance database reviewed.

## 2024-02-16 NOTE — Assessment & Plan Note (Signed)
 Chronic, stable Patient requesting refill on Adderall [facility] controlled subs database reviewed, refill approved and sent to pharmacy Continue Adderall 20 mg daily.

## 2024-02-16 NOTE — Assessment & Plan Note (Signed)
 Chronic Toller nifedipine 60 mg daily well, blood pressure well-controlled as well.  Continue regimen at this time.

## 2024-02-16 NOTE — Assessment & Plan Note (Signed)
 Chronic Continue to nifedipine 60 mg daily.

## 2024-02-16 NOTE — Progress Notes (Signed)
 Established Patient Office Visit  Subjective   Patient ID: Morgan Romero, female    DOB: 29-Sep-1976  Age: 48 y.o. MRN: [id number]  Chief Complaint  Patient presents with   Anxiety    Anxiety/Depression: Continues on Wellbutrin 30 mg daily, BuSpar 20 mg daily, alprazolam 0.5 mg twice daily.  Reports anxiety and depression has worsened recently due to her trying to buy a house which has been very stressful for her.  She denies suicidal ideation but does report at times it would be easier if she would just disappear.  No intention to harm self per patient report.  Raynaud's: At last office visit nifedipine increased to 60 mg daily, she has found some improvement in symptoms with this increase.  We also had discontinued her lisinopril-hydrochlorothiazide.     Review of Systems  Respiratory:  Negative for shortness of breath.   Cardiovascular:  Negative for chest pain.  Neurological:  Negative for dizziness.  Psychiatric/Behavioral:  Positive for depression. Negative for suicidal ideas. The patient is nervous/anxious.       Objective:     BP 126/82   Pulse 67   Temp 98.2 F (36.8 C)   Ht 5\' 4"  (1.626 m)   Wt 218 lb 4 oz (99 kg)   SpO2 98%   BMI 37.46 kg/m  BP Readings from Last 3 Encounters:  02/16/24 126/82  01/12/24 110/72  12/16/23 137/82   Wt Readings from Last 3 Encounters:  02/16/24 218 lb 4 oz (99 kg)  01/12/24 224 lb 2 oz (101.7 kg)  12/16/23 223 lb (101.2 kg)      Physical Exam Vitals reviewed.  Constitutional:      General: She is not in acute distress.    Appearance: Normal appearance.  HENT:     Head: Normocephalic and atraumatic.  Neck:     Vascular: No carotid bruit.  Cardiovascular:     Rate and Rhythm: Normal rate and regular rhythm.     Pulses: Normal pulses.     Heart sounds: Normal heart sounds.  Pulmonary:     Effort: Pulmonary effort is normal.     Breath sounds: Normal breath sounds.  Skin:    General: Skin is warm and dry.   Neurological:     General: No focal deficit present.     Mental Status: She is alert and oriented to person, place, and time.  Psychiatric:        Mood and Affect: Mood normal.        Behavior: Behavior normal.        Judgment: Judgment normal.      No results found for any visits on 02/16/24.    The 10-year ASCVD risk score (Arnett DK, et al., 2019) is: 1.3%    Assessment & Plan:   Problem List Items Addressed This Visit       Cardiovascular and Mediastinum   Hypertension   Chronic Continue to nifedipine 60 mg daily.      Raynaud's disease without gangrene   Chronic Toller nifedipine 60 mg daily well, blood pressure well-controlled as well.  Continue regimen at this time.        Other   Major depressive disorder, recurrent episode, moderate (HCC) - Primary   Chronic, recently worsened likely due to increase stressors regarding buying a house Consider Wellbutrin 20 mg daily, BuSpar 20 mg daily, alprazolam 0.5 mg twice daily.  Prescription refill of alprazolam to the pharmacy, [facility] controlled substance database reviewed.  Relevant Medications   ALPRAZolam (XANAX) 0.5 MG tablet   GAD (generalized anxiety disorder)   Chronic, recently worsened likely due to increase stressors regarding buying a house Consider Wellbutrin 20 mg daily, BuSpar 20 mg daily, alprazolam 0.5 mg twice daily.  Prescription refill of alprazolam to the pharmacy, [facility] controlled substance database reviewed.      Relevant Medications   ALPRAZolam (XANAX) 0.5 MG tablet   Attention deficit hyperactivity disorder (ADHD)   Chronic, stable Patient requesting refill on Adderall [place] controlled subs database reviewed, refill approved and sent to pharmacy Continue Adderall 20 mg daily.      Relevant Medications   amphetamine-dextroamphetamine (ADDERALL XR) 20 MG 24 hr capsule   Anxiety   Chronic, recently worsened likely due to increase stressors regarding buying  a house Consider Wellbutrin 20 mg daily, BuSpar 20 mg daily, alprazolam 0.5 mg twice daily.  Prescription refill of alprazolam to the pharmacy, [facility] controlled substance database reviewed.      Relevant Medications   ALPRAZolam (XANAX) 0.5 MG tablet    Return in about 7 months (around 09/17/2024) for F/U with Maralyn Sago.    Elenore Paddy, NP

## 2024-02-17 ENCOUNTER — Other Ambulatory Visit: Payer: Self-pay

## 2024-02-17 ENCOUNTER — Other Ambulatory Visit (HOSPITAL_COMMUNITY): Payer: Self-pay

## 2024-02-22 ENCOUNTER — Encounter: Payer: Self-pay | Admitting: Nurse Practitioner

## 2024-03-14 ENCOUNTER — Other Ambulatory Visit: Payer: Self-pay | Admitting: Nurse Practitioner

## 2024-03-14 ENCOUNTER — Other Ambulatory Visit (HOSPITAL_COMMUNITY): Payer: Self-pay

## 2024-03-14 ENCOUNTER — Other Ambulatory Visit: Payer: Self-pay

## 2024-03-14 DIAGNOSIS — F411 Generalized anxiety disorder: Secondary | ICD-10-CM

## 2024-03-14 DIAGNOSIS — F909 Attention-deficit hyperactivity disorder, unspecified type: Secondary | ICD-10-CM

## 2024-03-15 ENCOUNTER — Other Ambulatory Visit (HOSPITAL_COMMUNITY): Payer: Self-pay

## 2024-03-15 MED ORDER — AMPHETAMINE-DEXTROAMPHET ER 20 MG PO CP24
20.0000 mg | ORAL_CAPSULE | Freq: Every morning | ORAL | 0 refills | Status: DC
  Filled 2024-03-18: qty 30, 30d supply, fill #0

## 2024-03-15 MED ORDER — ALPRAZOLAM 0.5 MG PO TABS
0.5000 mg | ORAL_TABLET | Freq: Two times a day (BID) | ORAL | 0 refills | Status: DC | PRN
  Filled 2024-03-15 – 2024-03-17 (×2): qty 60, 30d supply, fill #0

## 2024-03-17 ENCOUNTER — Other Ambulatory Visit (HOSPITAL_COMMUNITY): Payer: Self-pay

## 2024-03-18 ENCOUNTER — Other Ambulatory Visit (HOSPITAL_COMMUNITY): Payer: Self-pay

## 2024-03-20 ENCOUNTER — Other Ambulatory Visit (HOSPITAL_COMMUNITY): Payer: Self-pay

## 2024-03-20 ENCOUNTER — Other Ambulatory Visit (HOSPITAL_BASED_OUTPATIENT_CLINIC_OR_DEPARTMENT_OTHER): Payer: Self-pay

## 2024-03-20 MED ORDER — TOPIRAMATE 25 MG PO TABS
25.0000 mg | ORAL_TABLET | Freq: Every day | ORAL | 0 refills | Status: DC
Start: 2024-03-20 — End: 2024-04-13
  Filled 2024-03-20: qty 60, 30d supply, fill #0

## 2024-04-13 ENCOUNTER — Other Ambulatory Visit: Payer: Self-pay

## 2024-04-13 ENCOUNTER — Other Ambulatory Visit (HOSPITAL_COMMUNITY): Payer: Self-pay

## 2024-04-13 ENCOUNTER — Other Ambulatory Visit: Payer: Self-pay | Admitting: Nurse Practitioner

## 2024-04-13 DIAGNOSIS — F909 Attention-deficit hyperactivity disorder, unspecified type: Secondary | ICD-10-CM

## 2024-04-13 DIAGNOSIS — I7300 Raynaud's syndrome without gangrene: Secondary | ICD-10-CM

## 2024-04-13 MED ORDER — NIFEDIPINE ER 60 MG PO TB24
60.0000 mg | ORAL_TABLET | Freq: Every day | ORAL | 2 refills | Status: DC
  Filled 2024-04-13 – 2024-04-17 (×2): qty 30, 30d supply, fill #0
  Filled 2024-05-10: qty 30, 30d supply, fill #1
  Filled 2024-06-14 – 2024-06-21 (×2): qty 30, 30d supply, fill #2
  Filled 2024-07-17: qty 30, 30d supply, fill #3
  Filled 2024-08-23: qty 30, 30d supply, fill #4

## 2024-04-13 MED ORDER — AMPHETAMINE-DEXTROAMPHET ER 20 MG PO CP24
20.0000 mg | ORAL_CAPSULE | Freq: Every morning | ORAL | 0 refills | Status: DC
  Filled 2024-04-17: qty 30, 30d supply, fill #0
  Filled ????-??-??: fill #0

## 2024-04-13 MED ORDER — TOPIRAMATE 25 MG PO TABS
25.0000 mg | ORAL_TABLET | Freq: Every day | ORAL | 3 refills | Status: DC
  Filled 2024-04-13 – 2024-05-25 (×5): qty 60, 30d supply, fill #0
  Filled 2024-07-17: qty 60, 30d supply, fill #1
  Filled 2024-08-23: qty 60, 30d supply, fill #2

## 2024-04-14 ENCOUNTER — Other Ambulatory Visit (HOSPITAL_COMMUNITY): Payer: Self-pay

## 2024-04-14 ENCOUNTER — Other Ambulatory Visit: Payer: Self-pay

## 2024-04-14 MED ORDER — TOPIRAMATE 25 MG PO TABS
25.0000 mg | ORAL_TABLET | Freq: Every day | ORAL | 3 refills | Status: AC
  Filled 2024-04-14 – 2024-04-21 (×2): qty 60, 30d supply, fill #0
  Filled 2024-06-14 – 2024-09-26 (×2): qty 60, 30d supply, fill #1
  Filled 2024-11-28: qty 60, 30d supply, fill #2

## 2024-04-17 ENCOUNTER — Other Ambulatory Visit: Payer: Self-pay

## 2024-04-17 ENCOUNTER — Other Ambulatory Visit (HOSPITAL_COMMUNITY): Payer: Self-pay

## 2024-04-21 ENCOUNTER — Other Ambulatory Visit (HOSPITAL_COMMUNITY): Payer: Self-pay

## 2024-04-21 ENCOUNTER — Encounter (HOSPITAL_COMMUNITY): Payer: Self-pay

## 2024-05-10 ENCOUNTER — Other Ambulatory Visit (HOSPITAL_COMMUNITY): Payer: Self-pay

## 2024-05-10 ENCOUNTER — Other Ambulatory Visit: Payer: Self-pay | Admitting: Nurse Practitioner

## 2024-05-10 ENCOUNTER — Other Ambulatory Visit: Payer: Self-pay

## 2024-05-10 DIAGNOSIS — F411 Generalized anxiety disorder: Secondary | ICD-10-CM

## 2024-05-10 DIAGNOSIS — R21 Rash and other nonspecific skin eruption: Secondary | ICD-10-CM

## 2024-05-10 DIAGNOSIS — F909 Attention-deficit hyperactivity disorder, unspecified type: Secondary | ICD-10-CM

## 2024-05-10 MED ORDER — POTASSIUM CHLORIDE ER 10 MEQ PO TBCR
10.0000 meq | EXTENDED_RELEASE_TABLET | Freq: Every day | ORAL | 1 refills | Status: AC
Start: 2024-05-10 — End: ?
  Filled 2024-05-10: qty 30, 30d supply, fill #0

## 2024-05-10 MED ORDER — ALPRAZOLAM 0.5 MG PO TABS
0.5000 mg | ORAL_TABLET | Freq: Two times a day (BID) | ORAL | 0 refills | Status: DC | PRN
  Filled 2024-05-10: qty 60, 30d supply, fill #0

## 2024-05-10 MED ORDER — TRIAMCINOLONE ACETONIDE 0.1 % EX CREA
1.0000 | TOPICAL_CREAM | Freq: Two times a day (BID) | CUTANEOUS | 0 refills | Status: DC
  Filled 2024-05-10: qty 30, 15d supply, fill #0

## 2024-05-10 MED ORDER — METFORMIN HCL ER 500 MG PO TB24
1000.0000 mg | ORAL_TABLET | Freq: Two times a day (BID) | ORAL | 3 refills | Status: DC
  Filled 2024-05-10: qty 120, 30d supply, fill #0
  Filled 2024-06-14 – 2024-06-21 (×2): qty 120, 30d supply, fill #1
  Filled 2024-07-17: qty 120, 30d supply, fill #2
  Filled 2024-08-23: qty 120, 30d supply, fill #3

## 2024-05-10 MED ORDER — AMPHETAMINE-DEXTROAMPHET ER 20 MG PO CP24
20.0000 mg | ORAL_CAPSULE | Freq: Every morning | ORAL | 0 refills | Status: DC
Start: 2024-05-10 — End: 2024-06-21
  Filled 2024-05-10 – 2024-05-25 (×4): qty 30, 30d supply, fill #0

## 2024-05-11 ENCOUNTER — Other Ambulatory Visit (HOSPITAL_COMMUNITY): Payer: Self-pay

## 2024-05-11 ENCOUNTER — Other Ambulatory Visit: Payer: Self-pay

## 2024-05-12 ENCOUNTER — Other Ambulatory Visit (HOSPITAL_COMMUNITY): Payer: Self-pay

## 2024-05-15 ENCOUNTER — Other Ambulatory Visit (HOSPITAL_COMMUNITY): Payer: Self-pay

## 2024-05-25 ENCOUNTER — Other Ambulatory Visit (HOSPITAL_COMMUNITY): Payer: Self-pay

## 2024-06-14 ENCOUNTER — Other Ambulatory Visit: Payer: Self-pay | Admitting: Family

## 2024-06-14 DIAGNOSIS — F411 Generalized anxiety disorder: Secondary | ICD-10-CM

## 2024-06-14 DIAGNOSIS — F909 Attention-deficit hyperactivity disorder, unspecified type: Secondary | ICD-10-CM

## 2024-06-15 ENCOUNTER — Other Ambulatory Visit: Payer: Self-pay

## 2024-06-15 ENCOUNTER — Other Ambulatory Visit (HOSPITAL_COMMUNITY): Payer: Self-pay

## 2024-06-20 ENCOUNTER — Telehealth: Payer: Self-pay | Admitting: Nurse Practitioner

## 2024-06-20 ENCOUNTER — Other Ambulatory Visit (HOSPITAL_COMMUNITY): Payer: Self-pay

## 2024-06-20 MED ORDER — METFORMIN HCL ER 500 MG PO TB24
1000.0000 mg | ORAL_TABLET | Freq: Two times a day (BID) | ORAL | 3 refills | Status: DC
  Filled 2024-06-20: qty 120, 30d supply, fill #0

## 2024-06-20 MED ORDER — NALTREXONE HCL 50 MG PO TABS
25.0000 mg | ORAL_TABLET | Freq: Every day | ORAL | 5 refills | Status: AC
  Filled 2024-06-20 – 2024-06-21 (×2): qty 15, 30d supply, fill #0
  Filled 2024-07-17: qty 15, 30d supply, fill #1
  Filled 2024-08-23: qty 15, 30d supply, fill #2

## 2024-06-20 MED ORDER — TOPIRAMATE 25 MG PO TABS
25.0000 mg | ORAL_TABLET | Freq: Every day | ORAL | 0 refills | Status: DC
  Filled 2024-06-20 – 2024-06-21 (×2): qty 60, 30d supply, fill #0

## 2024-06-20 NOTE — Telephone Encounter (Signed)
 Copied from CRM #[id number]. Topic: Clinical - Medication Prior Auth >> Jun 20, 2024  2:26 PM Gennette ORN wrote: Reason for CRM: Patient is still waiting on update will prior authorization for her medication. Please confirm the update with patient at [phone].

## 2024-06-21 ENCOUNTER — Other Ambulatory Visit (HOSPITAL_COMMUNITY): Payer: Self-pay

## 2024-06-21 ENCOUNTER — Other Ambulatory Visit: Payer: Self-pay

## 2024-06-21 ENCOUNTER — Other Ambulatory Visit: Payer: Self-pay | Admitting: Family

## 2024-06-21 ENCOUNTER — Other Ambulatory Visit: Payer: Self-pay | Admitting: Nurse Practitioner

## 2024-06-21 DIAGNOSIS — F411 Generalized anxiety disorder: Secondary | ICD-10-CM

## 2024-06-21 DIAGNOSIS — F909 Attention-deficit hyperactivity disorder, unspecified type: Secondary | ICD-10-CM

## 2024-06-21 MED ORDER — AMPHETAMINE-DEXTROAMPHET ER 20 MG PO CP24
20.0000 mg | ORAL_CAPSULE | Freq: Every morning | ORAL | 0 refills | Status: DC
  Filled 2024-06-21: qty 30, 30d supply, fill #0

## 2024-06-21 MED ORDER — ALPRAZOLAM 0.5 MG PO TABS
0.5000 mg | ORAL_TABLET | Freq: Two times a day (BID) | ORAL | 0 refills | Status: DC | PRN
  Filled 2024-06-21: qty 60, 30d supply, fill #0

## 2024-06-21 NOTE — Telephone Encounter (Signed)
 Copied from CRM [id number]. Topic: Clinical - Medication Refill >> Jun 21, 2024 11:46 AM Tanazia G wrote: Medication: ALPRAZolam  (XANAX ) 0.5 MG tablet  amphetamine -dextroamphetamine  (ADDERALL XR) 20 MG 24 hr capsule   Has the patient contacted their pharmacy? Yes (Agent: If no, request that the patient contact the pharmacy for the refill. If patient does not wish to contact the pharmacy document the reason why and proceed with request.) (Agent: If yes, when and what did the pharmacy advise?)  This is the patient's preferred pharmacy:  [place] [facility] [address] [place] [address] Phone: [phone] Fax: [phone]  Is this the correct pharmacy for this prescription? Yes If no, delete pharmacy and type the correct one.   Has the prescription been filled recently? Yes  Is the patient out of the medication? Yes  Has the patient been seen for an appointment in the last year OR does the patient have an upcoming appointment? Yes  Can we respond through MyChart? Yes  Agent: Please be advised that Rx refills may take up to 3 business days. We ask that you follow-up with your pharmacy.

## 2024-06-21 NOTE — Telephone Encounter (Signed)
 Second request

## 2024-06-22 ENCOUNTER — Other Ambulatory Visit: Payer: Self-pay

## 2024-06-22 ENCOUNTER — Other Ambulatory Visit (HOSPITAL_COMMUNITY): Payer: Self-pay

## 2024-07-03 NOTE — Telephone Encounter (Signed)
 Medication was send on 06/21/24 by Douglass

## 2024-07-17 ENCOUNTER — Other Ambulatory Visit (HOSPITAL_COMMUNITY): Payer: Self-pay

## 2024-07-17 ENCOUNTER — Other Ambulatory Visit: Payer: Self-pay | Admitting: Family

## 2024-07-17 DIAGNOSIS — F909 Attention-deficit hyperactivity disorder, unspecified type: Secondary | ICD-10-CM

## 2024-07-18 ENCOUNTER — Other Ambulatory Visit: Payer: Self-pay

## 2024-07-20 ENCOUNTER — Other Ambulatory Visit (HOSPITAL_COMMUNITY): Payer: Self-pay

## 2024-07-26 ENCOUNTER — Other Ambulatory Visit (HOSPITAL_COMMUNITY): Payer: Self-pay

## 2024-07-26 ENCOUNTER — Other Ambulatory Visit: Payer: Self-pay | Admitting: Nurse Practitioner

## 2024-07-26 DIAGNOSIS — F909 Attention-deficit hyperactivity disorder, unspecified type: Secondary | ICD-10-CM

## 2024-07-26 MED ORDER — AMPHETAMINE-DEXTROAMPHET ER 20 MG PO CP24
20.0000 mg | ORAL_CAPSULE | Freq: Every morning | ORAL | 0 refills | Status: DC
  Filled 2024-07-26: qty 30, 30d supply, fill #0

## 2024-07-27 ENCOUNTER — Other Ambulatory Visit (HOSPITAL_COMMUNITY): Payer: Self-pay

## 2024-07-27 ENCOUNTER — Other Ambulatory Visit: Payer: Self-pay

## 2024-08-23 ENCOUNTER — Other Ambulatory Visit (HOSPITAL_COMMUNITY): Payer: Self-pay

## 2024-08-23 ENCOUNTER — Other Ambulatory Visit: Payer: Self-pay

## 2024-08-23 ENCOUNTER — Other Ambulatory Visit: Payer: Self-pay | Admitting: Family

## 2024-08-23 DIAGNOSIS — F909 Attention-deficit hyperactivity disorder, unspecified type: Secondary | ICD-10-CM

## 2024-08-23 DIAGNOSIS — F411 Generalized anxiety disorder: Secondary | ICD-10-CM

## 2024-08-24 ENCOUNTER — Other Ambulatory Visit (HOSPITAL_COMMUNITY): Payer: Self-pay

## 2024-08-31 ENCOUNTER — Other Ambulatory Visit: Payer: Self-pay | Admitting: Family

## 2024-08-31 ENCOUNTER — Other Ambulatory Visit (HOSPITAL_COMMUNITY): Payer: Self-pay

## 2024-08-31 DIAGNOSIS — F411 Generalized anxiety disorder: Secondary | ICD-10-CM

## 2024-08-31 DIAGNOSIS — F909 Attention-deficit hyperactivity disorder, unspecified type: Secondary | ICD-10-CM

## 2024-09-14 ENCOUNTER — Other Ambulatory Visit (HOSPITAL_COMMUNITY): Payer: Self-pay

## 2024-09-14 MED ORDER — METFORMIN HCL ER 500 MG PO TB24: 1000.0000 mg | ORAL_TABLET | Freq: Two times a day (BID) | ORAL | 3 refills | Status: AC

## 2024-09-14 MED ORDER — METFORMIN HCL ER 500 MG PO TB24
1000.0000 mg | ORAL_TABLET | Freq: Two times a day (BID) | ORAL | 5 refills | Status: DC
  Filled 2024-09-14: qty 120, 30d supply, fill #0

## 2024-09-14 MED ORDER — NALTREXONE HCL 50 MG PO TABS
50.0000 mg | ORAL_TABLET | Freq: Every day | ORAL | 5 refills | Status: AC
  Filled 2024-09-14: qty 30, 30d supply, fill #0
  Filled 2024-11-28: qty 30, 30d supply, fill #1

## 2024-09-15 ENCOUNTER — Other Ambulatory Visit (HOSPITAL_COMMUNITY): Payer: Self-pay

## 2024-09-17 ENCOUNTER — Other Ambulatory Visit (HOSPITAL_COMMUNITY): Payer: Self-pay

## 2024-09-18 ENCOUNTER — Other Ambulatory Visit: Payer: Self-pay

## 2024-09-19 ENCOUNTER — Other Ambulatory Visit: Payer: Self-pay

## 2024-09-21 ENCOUNTER — Ambulatory Visit: Admitting: Nurse Practitioner

## 2024-09-21 ENCOUNTER — Other Ambulatory Visit: Payer: Self-pay

## 2024-09-21 ENCOUNTER — Other Ambulatory Visit (HOSPITAL_COMMUNITY): Payer: Self-pay

## 2024-09-21 VITALS — BP 134/80 | HR 81 | Temp 98.3°F | Ht 64.0 in | Wt 218.2 lb

## 2024-09-21 DIAGNOSIS — F411 Generalized anxiety disorder: Secondary | ICD-10-CM | POA: Diagnosis not present

## 2024-09-21 DIAGNOSIS — F331 Major depressive disorder, recurrent, moderate: Secondary | ICD-10-CM

## 2024-09-21 DIAGNOSIS — F909 Attention-deficit hyperactivity disorder, unspecified type: Secondary | ICD-10-CM | POA: Diagnosis not present

## 2024-09-21 MED ORDER — ALPRAZOLAM 0.5 MG PO TABS
0.5000 mg | ORAL_TABLET | Freq: Two times a day (BID) | ORAL | 0 refills | Status: AC | PRN
  Filled 2024-09-21: qty 60, 30d supply, fill #0

## 2024-09-21 MED ORDER — AMPHETAMINE-DEXTROAMPHET ER 20 MG PO CP24
20.0000 mg | ORAL_CAPSULE | Freq: Every morning | ORAL | 0 refills | Status: DC
  Filled 2024-09-21: qty 30, 30d supply, fill #0

## 2024-09-21 NOTE — Assessment & Plan Note (Signed)
 Major depressive disorder and generalized anxiety disorder Mood stable with Wellbutrin  300 mg daily and Alprazolam  0.5 mg as needed. No self-harm ideation. - Refill Alprazolam  0.5 mg as needed prescription. - [place] Controlled substance database reviewed

## 2024-09-21 NOTE — Progress Notes (Signed)
 Established Patient Office Visit  Subjective   Patient ID: Morgan Romero, female    DOB: [birth year]  Age: 48 y.o. MRN: [id number]  Chief Complaint  Patient presents with   Anxiety    Discussed the use of AI scribe software for clinical note transcription with the patient, who gave verbal consent to proceed.  History of Present Illness Morgan Romero is a 49 year old female who presents for medication management and follow-up.  Vasospastic symptoms (raynaud's phenomenon) - Takes nifedipine  60 mg for Raynaud's phenomenon with good efficacy - Did not take nifedipine  this morning, which may affect current blood pressure  Mood and psychiatric symptoms - Takes Adderall XR 20 mg daily, Wellbutrin  300 mg daily, and Xanax  0.5 mg as needed - Requires refills for Adderall and Xanax  - Mood is stable - No worsening of depression or anxiety - No thoughts of self-harm  Weight management and metabolic health - S/p weight loss surgery - Working with a nutritionist and a weight loss medicine physician - Takes metformin , naltrexone , and topiramate  for weight management - Recently started going to the gym  Sleep disturbance - Weight loss doctor recommended a sleep study for suspected sleep apnea      ROS:see HPI    Objective:     BP 134/80   Pulse 81   Temp 98.3 F (36.8 C) (Temporal)   Ht 5' 4 (1.626 m)   Wt 218 lb 4 oz (99 kg)   SpO2 98%   BMI 37.46 kg/m  BP Readings from Last 3 Encounters:  09/21/24 134/80  02/16/24 126/82  01/12/24 110/72   Wt Readings from Last 3 Encounters:  09/21/24 218 lb 4 oz (99 kg)  02/16/24 218 lb 4 oz (99 kg)  01/12/24 224 lb 2 oz (101.7 kg)        09/21/2024    2:47 PM 01/12/2024    4:47 PM 03/05/2023    4:10 PM  PHQ9 SCORE ONLY  PHQ-9 Total Score 6 0  0      Data saved with a previous flowsheet row definition     Physical Exam Vitals reviewed.  Constitutional:      General: She is not in acute distress.     Appearance: Normal appearance.  HENT:     Head: Normocephalic and atraumatic.  Neck:     Vascular: No carotid bruit.  Cardiovascular:     Rate and Rhythm: Normal rate and regular rhythm.     Pulses: Normal pulses.     Heart sounds: Normal heart sounds.  Pulmonary:     Effort: Pulmonary effort is normal.     Breath sounds: Normal breath sounds.  Skin:    General: Skin is warm and dry.  Neurological:     General: No focal deficit present.     Mental Status: She is alert and oriented to person, place, and time.  Psychiatric:        Mood and Affect: Mood normal.        Behavior: Behavior normal.        Judgment: Judgment normal.      No results found for any visits on 09/21/24.    The 10-year ASCVD risk score (Arnett DK, et al., 2019) is: 0.9%    Assessment & Plan:   Problem List Items Addressed This Visit       Other   Major depressive disorder, recurrent episode, moderate (HCC)   Major depressive disorder and generalized anxiety disorder Mood stable with  Wellbutrin  300 mg daily and Alprazolam  0.5 mg as needed. No self-harm ideation. - Refill Alprazolam  0.5 mg as needed prescription. - [place] Controlled substance database reviewed      Relevant Medications   ALPRAZolam  (XANAX ) 0.5 MG tablet   GAD (generalized anxiety disorder)   Major depressive disorder and generalized anxiety disorder Mood stable with Wellbutrin  300 mg daily and Alprazolam  0.5 mg as needed. No self-harm ideation. - Refill Alprazolam  0.5 mg as needed prescription. - [place] Controlled substance database reviewed      Relevant Medications   ALPRAZolam  (XANAX ) 0.5 MG tablet   Attention deficit hyperactivity disorder (ADHD) - Primary   Attention-deficit hyperactivity disorder (ADHD) ADHD managed with Adderall XR 20 mg daily, effective for her needs. - Refill Adderall XR 20 mg daily prescription. - [place] controlled substance database reviewed      Relevant Medications   amphetamine -dextroamphetamine   (ADDERALL XR) 20 MG 24 hr capsule   Assessment and Plan Assessment & Plan Attention-deficit hyperactivity disorder (ADHD) ADHD managed with Adderall XR 20 mg daily, effective for her needs. - Refill Adderall XR 20 mg daily prescription. - [place] controlled substance database reviewed  Major depressive disorder and generalized anxiety disorder Mood stable with Wellbutrin  300 mg daily and Alprazolam  0.5 mg as needed. No self-harm ideation. - Refill Alprazolam  0.5 mg as needed prescription. -  Controlled substance database reviewed  Raynaud's disease Raynaud's managed with Nifedipine  60 mg daily, effective.  Essential hypertension Slightly elevated blood pressure, likely due to missed Nifedipine  dose. No other antihypertensives prescribed.  Follow-Up - Recommend physical examination in late December or early January. - Schedule follow-up visits every three months, in-person or virtually.    Return in about 3 months (around 12/22/2024) for CPE with Lauraine.    Lauraine FORBES Pereyra, NP

## 2024-09-21 NOTE — Assessment & Plan Note (Signed)
 Attention-deficit hyperactivity disorder (ADHD) ADHD managed with Adderall XR 20 mg daily, effective for her needs. - Refill Adderall XR 20 mg daily prescription. - [place] controlled substance database reviewed

## 2024-09-26 ENCOUNTER — Other Ambulatory Visit: Payer: Self-pay | Admitting: Nurse Practitioner

## 2024-09-26 ENCOUNTER — Other Ambulatory Visit: Payer: Self-pay

## 2024-09-26 ENCOUNTER — Other Ambulatory Visit (HOSPITAL_COMMUNITY): Payer: Self-pay

## 2024-09-26 DIAGNOSIS — I7300 Raynaud's syndrome without gangrene: Secondary | ICD-10-CM

## 2024-09-26 DIAGNOSIS — D509 Iron deficiency anemia, unspecified: Secondary | ICD-10-CM

## 2024-09-26 MED ORDER — NIFEDIPINE ER OSMOTIC RELEASE 60 MG PO TB24
60.0000 mg | ORAL_TABLET | Freq: Every day | ORAL | 3 refills | Status: AC
  Filled 2024-09-26: qty 30, 30d supply, fill #0
  Filled 2024-11-28: qty 30, 30d supply, fill #1

## 2024-09-26 MED ORDER — IRON (FERROUS SULFATE) 325 (65 FE) MG PO TABS
325.0000 mg | ORAL_TABLET | Freq: Every day | ORAL | 0 refills | Status: AC
  Filled 2024-09-26: qty 90, 90d supply, fill #0

## 2024-11-28 ENCOUNTER — Other Ambulatory Visit: Payer: Self-pay

## 2024-11-28 ENCOUNTER — Other Ambulatory Visit: Payer: Self-pay | Admitting: Nurse Practitioner

## 2024-11-28 DIAGNOSIS — F411 Generalized anxiety disorder: Secondary | ICD-10-CM

## 2024-11-28 DIAGNOSIS — F909 Attention-deficit hyperactivity disorder, unspecified type: Secondary | ICD-10-CM

## 2024-11-29 ENCOUNTER — Other Ambulatory Visit: Payer: Self-pay

## 2024-11-29 ENCOUNTER — Other Ambulatory Visit (HOSPITAL_COMMUNITY): Payer: Self-pay

## 2024-11-29 MED ORDER — AMPHETAMINE-DEXTROAMPHET ER 20 MG PO CP24
20.0000 mg | ORAL_CAPSULE | Freq: Every morning | ORAL | 0 refills | Status: AC
  Filled 2024-11-29: qty 30, 30d supply, fill #0

## 2024-11-29 MED ORDER — BUPROPION HCL ER (XL) 300 MG PO TB24
300.0000 mg | ORAL_TABLET | Freq: Every morning | ORAL | 3 refills | Status: AC
  Filled 2024-11-29: qty 30, 30d supply, fill #0

## 2024-12-07 ENCOUNTER — Other Ambulatory Visit: Payer: Self-pay

## 2024-12-28 ENCOUNTER — Ambulatory Visit: Admitting: Nurse Practitioner

## 2025-01-19 ENCOUNTER — Ambulatory Visit: Admitting: Nurse Practitioner

## 2025-02-22 ENCOUNTER — Ambulatory Visit: Admitting: Nurse Practitioner
